# Patient Record
Sex: Female | Born: 1935
Health system: Southern US, Community
[De-identification: ages and names within clinical notes are randomized; demographics above are authoritative.]

## PROBLEM LIST (undated history)

## (undated) DIAGNOSIS — J189 Pneumonia, unspecified organism: Secondary | ICD-10-CM

## (undated) DIAGNOSIS — Z789 Other specified health status: Secondary | ICD-10-CM

## (undated) HISTORY — PX: OTHER SURGICAL HISTORY: SHX169

## (undated) HISTORY — PX: APPENDECTOMY: SHX54

---

## 2003-06-21 ENCOUNTER — Inpatient Hospital Stay (HOSPITAL_COMMUNITY): Admission: EM | Admit: 2003-06-21 | Discharge: 2003-06-24 | Payer: Self-pay | Admitting: *Deleted

## 2003-06-27 ENCOUNTER — Encounter: Admission: RE | Admit: 2003-06-27 | Discharge: 2003-06-27 | Payer: Self-pay | Admitting: Family Medicine

## 2011-06-26 ENCOUNTER — Inpatient Hospital Stay (HOSPITAL_COMMUNITY)
Admission: EM | Admit: 2011-06-26 | Discharge: 2011-06-30 | DRG: 195 | Disposition: A | Discharge status: Home / Self Care | Payer: Medicare Other | Attending: Internal Medicine | Admitting: Internal Medicine

## 2011-06-26 ENCOUNTER — Emergency Department (HOSPITAL_COMMUNITY): Payer: Medicare Other

## 2011-06-26 DIAGNOSIS — E876 Hypokalemia: Secondary | ICD-10-CM | POA: Diagnosis present

## 2011-06-26 DIAGNOSIS — R0902 Hypoxemia: Secondary | ICD-10-CM | POA: Diagnosis present

## 2011-06-26 DIAGNOSIS — J189 Pneumonia, unspecified organism: Principal | ICD-10-CM

## 2011-06-26 DIAGNOSIS — D72829 Elevated white blood cell count, unspecified: Secondary | ICD-10-CM | POA: Diagnosis present

## 2011-06-26 HISTORY — DX: Pneumonia, unspecified organism: J18.9

## 2011-06-26 HISTORY — DX: Other specified health status: Z78.9

## 2011-06-26 LAB — DIFFERENTIAL
Lymphocytes Relative: 5 % — ABNORMAL LOW (ref 12–46)
Monocytes Absolute: 1.3 10*3/uL — ABNORMAL HIGH (ref 0.1–1.0)
Monocytes Relative: 8 % (ref 3–12)
Neutro Abs: 14.5 10*3/uL — ABNORMAL HIGH (ref 1.7–7.7)
Neutrophils Relative %: 87 % — ABNORMAL HIGH (ref 43–77)

## 2011-06-26 LAB — BASIC METABOLIC PANEL
BUN: 16 mg/dL (ref 6–23)
CO2: 29 mEq/L (ref 19–32)
Calcium: 8.1 mg/dL — ABNORMAL LOW (ref 8.4–10.5)
Chloride: 99 mEq/L (ref 96–112)
Creatinine, Ser: 0.56 mg/dL (ref 0.50–1.10)
GFR calc Af Amer: >90 mL/min (ref >90)
GFR calc non Af Amer: 84 mL/min — ABNORMAL LOW (ref >90)
Glucose, Bld: 158 mg/dL — ABNORMAL HIGH (ref 70–99)
Potassium: 3.5 mEq/L (ref 3.5–5.1)
Sodium: 134 mEq/L — ABNORMAL LOW (ref 135–145)

## 2011-06-26 LAB — URINALYSIS, ROUTINE W REFLEX MICROSCOPIC
Glucose, UA: NEGATIVE mg/dL
Protein, ur: 30 mg/dL — AB
Urobilinogen, UA: 4 mg/dL — ABNORMAL HIGH (ref 0.0–1.0)

## 2011-06-26 LAB — URINE MICROSCOPIC-ADD ON

## 2011-06-26 LAB — COMPREHENSIVE METABOLIC PANEL
AST: 207 U/L — ABNORMAL HIGH (ref 0–37)
Albumin: 2.1 g/dL — ABNORMAL LOW (ref 3.5–5.2)
Alkaline Phosphatase: 174 U/L — ABNORMAL HIGH (ref 39–117)
BUN: 18 mg/dL (ref 6–23)
CO2: 29 mEq/L (ref 19–32)
Chloride: 99 mEq/L (ref 96–112)
Creatinine, Ser: 0.54 mg/dL (ref 0.50–1.10)
GFR calc non Af Amer: 85 mL/min — ABNORMAL LOW (ref >90)
Potassium: 2.8 mEq/L — ABNORMAL LOW (ref 3.5–5.1)
Total Bilirubin: 0.3 mg/dL (ref 0.3–1.2)

## 2011-06-26 LAB — CARDIAC PANEL(CRET KIN+CKTOT+MB+TROPI)
CK, MB: 1.6 ng/mL (ref 0.3–4.0)
Total CK: 27 U/L (ref 7–177)
Troponin I: <0.3 ng/mL (ref <0.30)

## 2011-06-26 LAB — CBC
HCT: 35.9 % — ABNORMAL LOW (ref 36.0–46.0)
Hemoglobin: 11.7 g/dL — ABNORMAL LOW (ref 12.0–15.0)
RBC: 3.91 MIL/uL (ref 3.87–5.11)
WBC: 16.7 10*3/uL — ABNORMAL HIGH (ref 4.0–10.5)

## 2011-06-26 LAB — MAGNESIUM: Magnesium: 2.2 mg/dL (ref 1.5–2.5)

## 2011-06-26 MED ORDER — POTASSIUM CHLORIDE CRYS ER 20 MEQ PO TBCR
40.0000 meq | EXTENDED_RELEASE_TABLET | ORAL | Status: AC
  Administered 2011-06-26: 40 meq via ORAL
  Filled 2011-06-26: qty 2

## 2011-06-26 MED ORDER — ALBUTEROL SULFATE (5 MG/ML) 0.5% IN NEBU
2.5000 mg | INHALATION_SOLUTION | Freq: Four times a day (QID) | RESPIRATORY_TRACT | Status: DC
  Administered 2011-06-27 (×3): 2.5 mg via RESPIRATORY_TRACT
  Filled 2011-06-26 (×4): qty 0.5

## 2011-06-26 MED ORDER — POTASSIUM CHLORIDE 10 MEQ/100ML IV SOLN
10.0000 meq | INTRAVENOUS | Status: AC
  Administered 2011-06-26 (×4): 10 meq via INTRAVENOUS
  Filled 2011-06-26 (×3): qty 100

## 2011-06-26 MED ORDER — MOXIFLOXACIN HCL IN NACL 400 MG/250ML IV SOLN
400.0000 mg | Freq: Once | INTRAVENOUS | Status: AC
  Administered 2011-06-26: 400 mg via INTRAVENOUS
  Filled 2011-06-26: qty 250

## 2011-06-26 MED ORDER — POTASSIUM CHLORIDE CRYS ER 20 MEQ PO TBCR
40.0000 meq | EXTENDED_RELEASE_TABLET | Freq: Once | ORAL | Status: AC
  Administered 2011-06-26: 40 meq via ORAL
  Filled 2011-06-26: qty 2

## 2011-06-26 MED ORDER — ALBUTEROL SULFATE (5 MG/ML) 0.5% IN NEBU
2.5000 mg | INHALATION_SOLUTION | RESPIRATORY_TRACT | Status: DC | PRN
  Administered 2011-06-26: 2.5 mg via RESPIRATORY_TRACT

## 2011-06-26 MED ORDER — ALBUTEROL SULFATE (5 MG/ML) 0.5% IN NEBU
5.0000 mg | INHALATION_SOLUTION | Freq: Once | RESPIRATORY_TRACT | Status: AC
  Administered 2011-06-26: 5 mg via RESPIRATORY_TRACT
  Filled 2011-06-26: qty 0.5

## 2011-06-26 MED ORDER — POTASSIUM CHLORIDE 20 MEQ PO PACK: 40.0000 meq | PACK | ORAL | Status: DC

## 2011-06-26 MED ORDER — IPRATROPIUM BROMIDE 0.02 % IN SOLN
0.5000 mg | Freq: Once | RESPIRATORY_TRACT | Status: AC
  Administered 2011-06-26: 0.5 mg via RESPIRATORY_TRACT
  Filled 2011-06-26: qty 2.5

## 2011-06-26 MED ORDER — POTASSIUM CHLORIDE IN NACL 40-0.9 MEQ/L-% IV SOLN
INTRAVENOUS | Status: DC
  Administered 2011-06-26 – 2011-06-28 (×5): via INTRAVENOUS
  Filled 2011-06-26 (×8): qty 1000

## 2011-06-26 MED ORDER — ALBUTEROL SULFATE (5 MG/ML) 0.5% IN NEBU: 2.5000 mg | INHALATION_SOLUTION | Freq: Once | RESPIRATORY_TRACT | Status: DC

## 2011-06-26 MED ORDER — GUAIFENESIN ER 600 MG PO TB12
600.0000 mg | ORAL_TABLET | Freq: Two times a day (BID) | ORAL | Status: DC
  Administered 2011-06-26 – 2011-06-30 (×8): 600 mg via ORAL
  Filled 2011-06-26 (×13): qty 1

## 2011-06-26 NOTE — ED Provider Notes (Signed)
History     CSN: [id number] Arrival date & time: 06/26/2011 12:54 PM   First MD Initiated Contact with Patient 06/26/11 1411      Chief Complaint  Patient presents with  . Weakness  . Cough    (Consider location/radiation/quality/duration/timing/severity/associated sxs/prior treatment) HPI Comments: PSA 77 year old woman who developed cough last Friday ago. She was seen by a doctor in the clinic in [place]. [facility] and was given for Biaxin.  Continued to feel poorly, and so she therefore sought reevaluation.  Patient is a 77 y.o. female presenting with cough. The history is provided by a relative and the patient. No language interpreter was used.  Cough This is a new problem. The current episode started more than 2 days ago. The problem occurs constantly. The problem has been gradually worsening. The cough is non-productive. Associated symptoms include sweats and shortness of breath. Treatments tried: Biaxin without clinical improvement. She is not a smoker.    History reviewed. No pertinent past medical history.  Past Surgical History  Procedure Date  . Hemoroidectomy     History reviewed. No pertinent family history.  History  Substance Use Topics  . Smoking status: Never Smoker   . Smokeless tobacco: Not on file  . Alcohol Use: No    OB History    Grav Para Term Preterm Abortions TAB SAB Ect Mult Living                  Review of Systems  Constitutional: Negative.  Negative for fever.  HENT: Negative.   Eyes: Negative.   Respiratory: Positive for cough and shortness of breath.   Cardiovascular: Negative.   Gastrointestinal: Negative.   Genitourinary: Negative.   Musculoskeletal: Negative.   Neurological: Negative.   Psychiatric/Behavioral: Negative.     Allergies  Penicillins  Home Medications   Current Outpatient Rx  Name Route Sig Dispense Refill  . CLARITHROMYCIN 500 MG PO TABS Oral Take 500 mg by mouth 2 (two) times daily. Pt started  on 06-25-11 for 7 day therapy      BP 111/62  Pulse 92  Temp(Src) 98.4 F (36.9 C) (Oral)  Resp 26  Ht 4' 11.5" (1.511 m)  Wt 130 lb (58.968 kg)  BMI 25.82 kg/m2  SpO2 92%  Physical Exam  Constitutional: She is oriented to person, place, and time.       Lady with a deep cough.  HENT:  Head: Normocephalic and atraumatic.  Right Ear: External ear normal.  Left Ear: External ear normal.  Mouth/Throat: Oropharynx is clear and moist.  Eyes: Conjunctivae and EOM are normal. Pupils are equal, round, and reactive to light.  Neck: Normal range of motion. Neck supple. No thyromegaly present.  Cardiovascular: Regular rhythm and normal heart sounds.        Resting tachycardia.  Pulmonary/Chest: Effort normal.       Has rhonchi over the right posterior chest.  Abdominal: Soft. Bowel sounds are normal. She exhibits no distension. There is no tenderness.  Musculoskeletal: Normal range of motion.  Lymphadenopathy:    She has no cervical adenopathy.  Neurological: She is alert and oriented to person, place, and time.       No sensory or motor deficit.  Skin: Skin is warm and dry.  Psychiatric: She has a normal mood and affect. Her behavior is normal.    ED Course  Procedures (including critical care time)  Labs Reviewed  CBC - Abnormal; Notable for the following:    WBC 16.7 (*)  Hemoglobin 11.7 (*)    HCT 35.9 (*)    All other components within normal limits  DIFFERENTIAL - Abnormal; Notable for the following:    Neutrophils Relative 87 (*)    Neutro Abs 14.5 (*)    Lymphocytes Relative 5 (*)    Monocytes Absolute 1.3 (*)    All other components within normal limits  COMPREHENSIVE METABOLIC PANEL - Abnormal; Notable for the following:    Potassium 2.8 (*)    Albumin 2.1 (*)    AST 207 (*)    ALT 145 (*)    Alkaline Phosphatase 174 (*)    GFR calc non Af Amer 85 (*)    All other components within normal limits  URINALYSIS, ROUTINE W REFLEX MICROSCOPIC - Abnormal; Notable  for the following:    Color, Urine AMBER (*) BIOCHEMICALS MAY BE AFFECTED BY COLOR   APPearance CLOUDY (*)    Hgb urine dipstick TRACE (*)    Bilirubin Urine MODERATE (*)    Ketones, ur TRACE (*)    Protein, ur 30 (*)    Urobilinogen, UA 4.0 (*)    All other components within normal limits  PRO B NATRIURETIC PEPTIDE - Abnormal; Notable for the following:    Pro B Natriuretic peptide (BNP) 492.1 (*)    All other components within normal limits  D-DIMER, QUANTITATIVE - Abnormal; Notable for the following:    D-Dimer, Quant 2.91 (*)    All other components within normal limits  URINE MICROSCOPIC-ADD ON - Abnormal; Notable for the following:    Squamous Epithelial / LPF FEW (*)    Bacteria, UA MANY (*)    All other components within normal limits  CARDIAC PANEL(CRET KIN+CKTOT+MB+TROPI)  LACTIC ACID, PLASMA  PROCALCITONIN  URINE CULTURE  CULTURE, BLOOD (ROUTINE X 2)  CULTURE, BLOOD (ROUTINE X 2)   Dg Chest 2 View  06/26/2011  *RADIOLOGY REPORT*  Clinical Data: Productive cough. Shortness of breath.  CHEST - 2 VIEW  Comparison: None.  Findings: Consolidation/infiltrate right middle lobe and right lung base.  Recommend follow-up until clearance.  Cardiomegaly and pulmonary vascular congestion/mild congestive heart failure.  No gross pneumothorax.  Tortuous aorta.  IMPRESSION: Consolidation/infiltrate right middle lobe and right lung base. Recommend follow-up until clearance.  Cardiomegaly and pulmonary vascular congestion/mild congestive heart failure.  Tortuous aorta.  Original Report Authenticated By: Fuller Canada, M.D.    Date: 06/26/2011  Rate: 112  Rhythm: sinus tachycardia  QRS Axis: left  Intervals: normal  ST/T Wave abnormalities: normal  Conduction Disutrbances:none  Narrative Interpretation: Normal EKG.  Old EKG Reviewed: unchanged  4:57 PM Lab workup reveals a right lower lobe pneumonia. Treatment will be with Rocephin and Zithromax. Call to try at hospitalists to  admit the patient  1. Community acquired pneumonia          Carleene Cooper III, MD 06/26/11 1700

## 2011-06-26 NOTE — ED Notes (Signed)
Urine specimen collected and sent to lab for potential testing

## 2011-06-26 NOTE — H&P (Signed)
Hospital Admission Note Date: 06/26/2011  Patient name: PHILIS DOKE Medical record number: [id number] Date of birth: 02-May-1926 Age: 77 y.o. Gender: female PCP: Tomi Bamberger, NP, NP  Chief Complaint: lethargy  History of Present Illness: Independent, active 77 yo with no medical problems presents with 1 week severe daytime sleepiness out of the blue.  No energy.  No appetite.  Some wheezing and mild shortness of breath.  No chest pain.  No sig. cough Meds: Medications Prior to Admission  Medication Dose Route Frequency Provider Last Rate Last Dose  . albuterol (PROVENTIL) (5 MG/ML) 0.5% nebulizer solution 5 mg  5 mg Nebulization Once Carleene Cooper III, MD   5 mg at 06/26/11 1430  . ipratropium (ATROVENT) nebulizer solution 0.5 mg  0.5 mg Nebulization Once Carleene Cooper III, MD   0.5 mg at 06/26/11 1430  . moxifloxacin (AVELOX) IVPB 400 mg  400 mg Intravenous Once Carleene Cooper III, MD   400 mg at 06/26/11 1708  . potassium chloride 10 mEq in 100 mL IVPB  10 mEq Intravenous Q1 Hr x 3 Buena Irish, MD      . potassium chloride SA (K-DUR,KLOR-CON) CR tablet 40 mEq  40 mEq Oral Once Carleene Cooper III, MD   40 mEq at 06/26/11 1720  . potassium chloride SA (K-DUR,KLOR-CON) CR tablet 40 mEq  40 mEq Oral STAT Carleene Cooper III, MD      . DISCONTD: albuterol (PROVENTIL) (5 MG/ML) 0.5% nebulizer solution 2.5 mg  2.5 mg Nebulization Once Carleene Cooper III, MD      . DISCONTD: potassium chloride (KLOR-CON) packet 40 mEq  40 mEq Oral STAT Buena Irish, MD       No current outpatient prescriptions on file as of 06/26/2011.     Allergies: Penicillins Past Medical History  Diagnosis Date  . Pneumonia 06/26/11    currently  . No pertinent past medical history     pt denies any problems   Past Surgical History  Procedure Date  . Hemoroidectomy    History reviewed. No pertinent family history. History   Social History  . Marital Status: Widowed    Spouse Name: N/A    Number  of Children: N/A  . Years of Education: N/A   Occupational History  . Not on file.   Social History Main Topics  . Smoking status: Never Smoker   . Smokeless tobacco: Never Used  . Alcohol Use: No  . Drug Use: No  . Sexually Active:    Other Topics Concern  . Not on file   Social History Narrative  . No narrative on file    Review of Systems: Review of Systems  Constitutional: Negative for fever, chills and weight loss.  HENT: Negative for hearing loss, ear pain, nosebleeds, congestion, sore throat, neck pain, tinnitus and ear discharge.   Eyes: Negative for blurred vision, double vision, photophobia, pain, discharge and redness.  Respiratory: Positive for cough, shortness of breath and wheezing. Negative for hemoptysis, sputum production and stridor.   Cardiovascular: Negative for chest pain, palpitations, orthopnea, claudication, leg swelling and PND.  Gastrointestinal: Negative for heartburn, nausea, vomiting, abdominal pain, diarrhea, constipation, blood in stool and melena.  Genitourinary: Negative for dysuria, urgency, frequency, hematuria and flank pain.  Musculoskeletal: Negative for myalgias, back pain, joint pain and falls.  Skin: Negative for itching and rash.  Neurological: Negative for dizziness, tremors, sensory change, speech change, focal weakness, seizures, loss of consciousness and headaches.  Endo/Heme/Allergies: Negative for environmental allergies and polydipsia. Does  not bruise/bleed easily.  Psychiatric/Behavioral: Negative for depression, suicidal ideas, hallucinations, memory loss and substance abuse. The patient is not nervous/anxious and does not have insomnia.   All other systems reviewed and are negative.     Physical Exam: Physical Exam  Constitutional: She is oriented to person, place, and time. She appears well-developed and well-nourished. No distress.  HENT:  Head: Normocephalic and atraumatic.  Nose: Nose normal.  Mouth/Throat:  Oropharynx is clear and moist. No oropharyngeal exudate.  Eyes: Conjunctivae and EOM are normal. Pupils are equal, round, and reactive to light. Right eye exhibits discharge. Left eye exhibits no discharge. No scleral icterus.  Neck: Normal range of motion. Neck supple. No JVD present. No tracheal deviation present. No thyromegaly present.  Cardiovascular: Normal rate, regular rhythm, normal heart sounds and intact distal pulses.  Exam reveals no gallop and no friction rub.   No murmur heard. Pulmonary/Chest: No stridor. She has wheezes. She has no rales. She exhibits no tenderness.       Right lung with poor air movement, some squeaks 2/3 lung field   Abdominal: Soft. Bowel sounds are normal. She exhibits no distension and no mass. There is no tenderness. There is no rebound and no guarding.  Musculoskeletal: She exhibits no edema and no tenderness.  Lymphadenopathy:    She has no cervical adenopathy.  Neurological: She is alert and oriented to person, place, and time. She has normal reflexes. No cranial nerve deficit. She exhibits normal muscle tone. Coordination normal.  Skin: Skin is warm. No rash noted. She is diaphoretic. No erythema. No pallor.  Psychiatric: She has a normal mood and affect. Her behavior is normal. Judgment and thought content normal.     Filed Vitals:   06/26/11 1305 06/26/11 1431 06/26/11 1540 06/26/11 1625  BP: 127/76   111/62  Pulse: 103   92  Temp: 98.4 F (36.9 C)     TempSrc: Oral     Resp: 24   26  Height:      Weight:      SpO2: 93% 93% 93% 92%    Lab results: Results for orders placed during the hospital encounter of 06/26/11  CBC      Component Value Range   WBC 16.7 (*) 4.0 - 10.5 (K/uL)   RBC 3.91  3.87 - 5.11 (MIL/uL)   Hemoglobin 11.7 (*) 12.0 - 15.0 (g/dL)   HCT 16.1 (*) 09.6 - 46.0 (%)   MCV 91.8  78.0 - 100.0 (fL)   MCH 29.9  26.0 - 34.0 (pg)   MCHC 32.6  30.0 - 36.0 (g/dL)   RDW 04.5  40.9 - 81.1 (%)   Platelets 316  150 - 400  (K/uL)  DIFFERENTIAL      Component Value Range   Neutrophils Relative 87 (*) 43 - 77 (%)   Neutro Abs 14.5 (*) 1.7 - 7.7 (K/uL)   Lymphocytes Relative 5 (*) 12 - 46 (%)   Lymphs Abs 0.9  0.7 - 4.0 (K/uL)   Monocytes Relative 8  3 - 12 (%)   Monocytes Absolute 1.3 (*) 0.1 - 1.0 (K/uL)   Eosinophils Relative 0  0 - 5 (%)   Eosinophils Absolute 0.0  0.0 - 0.7 (K/uL)   Basophils Relative 0  0 - 1 (%)   Basophils Absolute 0.0  0.0 - 0.1 (K/uL)  COMPREHENSIVE METABOLIC PANEL      Component Value Range   Sodium 137  135 - 145 (mEq/L)   Potassium 2.8 (*)  3.5 - 5.1 (mEq/L)   Chloride 99  96 - 112 (mEq/L)   CO2 29  19 - 32 (mEq/L)   Glucose, Bld 97  70 - 99 (mg/dL)   BUN 18  6 - 23 (mg/dL)   Creatinine, Ser 1.61  0.50 - 1.10 (mg/dL)   Calcium 8.4  8.4 - 09.6 (mg/dL)   Total Protein 6.2  6.0 - 8.3 (g/dL)   Albumin 2.1 (*) 3.5 - 5.2 (g/dL)   AST 045 (*) 0 - 37 (U/L)   ALT 145 (*) 0 - 35 (U/L)   Alkaline Phosphatase 174 (*) 39 - 117 (U/L)   Total Bilirubin 0.3  0.3 - 1.2 (mg/dL)   GFR calc non Af Amer 85 (*) >90 (mL/min)   GFR calc Af Amer >90  >90 (mL/min)  URINALYSIS, ROUTINE W REFLEX MICROSCOPIC      Component Value Range   Color, Urine AMBER (*) YELLOW    APPearance CLOUDY (*) CLEAR    Specific Gravity, Urine 1.028  1.005 - 1.030    pH 6.5  5.0 - 8.0    Glucose, UA NEGATIVE  NEGATIVE (mg/dL)   Hgb urine dipstick TRACE (*) NEGATIVE    Bilirubin Urine MODERATE (*) NEGATIVE    Ketones, ur TRACE (*) NEGATIVE (mg/dL)   Protein, ur 30 (*) NEGATIVE (mg/dL)   Urobilinogen, UA 4.0 (*) 0.0 - 1.0 (mg/dL)   Nitrite NEGATIVE  NEGATIVE    Leukocytes, UA NEGATIVE  NEGATIVE   CARDIAC PANEL(CRET KIN+CKTOT+MB+TROPI)      Component Value Range   Total CK 27  7 - 177 (U/L)   CK, MB 1.6  0.3 - 4.0 (ng/mL)   Troponin I <0.30  <0.30 (ng/mL)   Relative Index RELATIVE INDEX IS INVALID  0.0 - 2.5   PRO B NATRIURETIC PEPTIDE      Component Value Range   Pro B Natriuretic peptide (BNP) 492.1 (*) 0 -  450 (pg/mL)  D-DIMER, QUANTITATIVE      Component Value Range   D-Dimer, Quant 2.91 (*) 0.00 - 0.48 (ug/mL-FEU)  LACTIC ACID, PLASMA      Component Value Range   Lactic Acid, Venous 0.9  0.5 - 2.2 (mmol/L)  PROCALCITONIN      Component Value Range   Procalcitonin 0.17    URINE MICROSCOPIC-ADD ON      Component Value Range   Squamous Epithelial / LPF FEW (*) RARE    RBC / HPF 7-10  <3 (RBC/hpf)   Bacteria, UA MANY (*) RARE    Urine-Other MUCOUS PRESENT     Imaging results:  Dg Chest 2 View  06/26/2011  *RADIOLOGY REPORT*  Clinical Data: Productive cough. Shortness of breath.  CHEST - 2 VIEW  Comparison: None.  Findings: Consolidation/infiltrate right middle lobe and right lung base.  Recommend follow-up until clearance.  Cardiomegaly and pulmonary vascular congestion/mild congestive heart failure.  No gross pneumothorax.  Tortuous aorta.  IMPRESSION: Consolidation/infiltrate right middle lobe and right lung base. Recommend follow-up until clearance.  Cardiomegaly and pulmonary vascular congestion/mild congestive heart failure.  Tortuous aorta.  Original Report Authenticated By: Fuller Canada, M.D.   Other results: EKG:  Normal sinus rhythm.  Normal EKG I personally reviewed the EKG.  Assessment & Plan by Problem:  #1. Pneumonia-Patient is tachypneic and has significant abnormal lung sounds.  Admit, IV Levaquin, nebs and follow.  #2.  Severe hypokalemia- Likely secondary to no eating.  Replace.  Check Magnesium.  Patient feels much better already.  Patient Active Problem List  Diagnoses  . Pneumonia  . Hypokalemia    Greater than 70 minutes was spent on direct patient care and coordination of care.  Kysen Wetherington 06/26/2011, 6:38 PM

## 2011-06-26 NOTE — ED Notes (Signed)
Pt brought by ems with c/o cough and weakness since last Friday. Pt went to pcp yesterday and was prescribed po antibiotics but pt not feeling better and pt was supposed to go back to pcp today for chest xray.

## 2011-06-26 NOTE — ED Notes (Signed)
Pt's son reports patient has had a cough and weakness for the past week. Went to PCP yesterday and was prescribed an ABX. Son reports pt feeling worse and came to the ER for further evaluation. Pt's bilateral lungs have expiratory wheezing. Pt alert and oriented x 3, neuro intact.

## 2011-06-27 DIAGNOSIS — D72829 Elevated white blood cell count, unspecified: Secondary | ICD-10-CM | POA: Diagnosis present

## 2011-06-27 LAB — CBC
HCT: 33.3 % — ABNORMAL LOW (ref 36.0–46.0)
Hemoglobin: 10.5 g/dL — ABNORMAL LOW (ref 12.0–15.0)
MCH: 29.7 pg (ref 26.0–34.0)
MCHC: 31.5 g/dL (ref 30.0–36.0)
MCV: 94.1 fL (ref 78.0–100.0)
Platelets: 310 K/uL (ref 150–400)
RBC: 3.54 MIL/uL — ABNORMAL LOW (ref 3.87–5.11)
RDW: 13.2 % (ref 11.5–15.5)
WBC: 13 K/uL — ABNORMAL HIGH (ref 4.0–10.5)

## 2011-06-27 LAB — BASIC METABOLIC PANEL WITH GFR
BUN: 13 mg/dL (ref 6–23)
CO2: 28 meq/L (ref 19–32)
Calcium: 8.3 mg/dL — ABNORMAL LOW (ref 8.4–10.5)
Chloride: 107 meq/L (ref 96–112)
Creatinine, Ser: 0.6 mg/dL (ref 0.50–1.10)
GFR calc Af Amer: >90 mL/min (ref >90)
GFR calc non Af Amer: 82 mL/min — ABNORMAL LOW (ref >90)
Glucose, Bld: 115 mg/dL — ABNORMAL HIGH (ref 70–99)
Potassium: 4.3 meq/L (ref 3.5–5.1)
Sodium: 140 meq/L (ref 135–145)

## 2011-06-27 LAB — STREP PNEUMONIAE URINARY ANTIGEN: Strep Pneumo Urinary Antigen: NEGATIVE

## 2011-06-27 LAB — URINE CULTURE: Colony Count: >=100000

## 2011-06-27 LAB — LEGIONELLA ANTIGEN, URINE: Legionella Antigen, Urine: NEGATIVE

## 2011-06-27 LAB — RAPID HIV SCREEN (WH-MAU): Rapid HIV Screen: NONREACTIVE

## 2011-06-27 MED ORDER — ALBUTEROL SULFATE (5 MG/ML) 0.5% IN NEBU
2.5000 mg | INHALATION_SOLUTION | Freq: Three times a day (TID) | RESPIRATORY_TRACT | Status: DC
  Administered 2011-06-28 – 2011-06-30 (×7): 2.5 mg via RESPIRATORY_TRACT
  Filled 2011-06-27 (×7): qty 0.5

## 2011-06-27 MED ORDER — ZOLPIDEM TARTRATE 5 MG PO TABS
5.0000 mg | ORAL_TABLET | Freq: Once | ORAL | Status: AC
  Administered 2011-06-27: 5 mg via ORAL
  Filled 2011-06-27: qty 1

## 2011-06-27 MED ORDER — ENOXAPARIN SODIUM 40 MG/0.4ML ~~LOC~~ SOLN
40.0000 mg | SUBCUTANEOUS | Status: DC
  Administered 2011-06-27 – 2011-06-30 (×4): 40 mg via SUBCUTANEOUS
  Filled 2011-06-27 (×6): qty 0.4

## 2011-06-27 MED ORDER — ENOXAPARIN SODIUM 40 MG/0.4ML ~~LOC~~ SOLN: 40.0000 mg | SUBCUTANEOUS | Status: DC

## 2011-06-27 MED ORDER — ZOLPIDEM TARTRATE 5 MG PO TABS
5.0000 mg | ORAL_TABLET | Freq: Every evening | ORAL | Status: DC | PRN
  Administered 2011-06-27 – 2011-06-29 (×3): 5 mg via ORAL
  Filled 2011-06-27 (×2): qty 1

## 2011-06-27 MED ORDER — POTASSIUM CHLORIDE CRYS ER 20 MEQ PO TBCR
20.0000 meq | EXTENDED_RELEASE_TABLET | Freq: Every day | ORAL | Status: DC
  Administered 2011-06-27 – 2011-06-30 (×4): 20 meq via ORAL
  Filled 2011-06-27 (×6): qty 1

## 2011-06-27 MED ORDER — LEVOFLOXACIN IN D5W 500 MG/100ML IV SOLN
500.0000 mg | INTRAVENOUS | Status: DC
  Administered 2011-06-27 – 2011-06-29 (×3): 500 mg via INTRAVENOUS
  Filled 2011-06-27 (×4): qty 100

## 2011-06-27 NOTE — Progress Notes (Signed)
ANTICOAGULATION CONSULT NOTE - Initial Consult  Pharmacy Consult for Lovenox  Indication: VTE prophylaxis  Allergies  Allergen Reactions  . Penicillins Hives    Patient Measurements: Height: 4' 11.5" (151.1 cm) Weight: 130 lb (58.968 kg) IBW/kg (Calculated) : 44.35  Adjusted Body Weight:   Vital Signs: Temp: 98.1 F (36.7 C) (12/13 0610) Temp src: Oral (12/13 0610) BP: 129/65 mmHg (12/13 0610) Pulse Rate: 85  (12/13 0610)  Labs:  Basename 06/27/11 0503 06/26/11 2030 06/26/11 1520  HGB 10.5* -- 11.7*  HCT 33.3* -- 35.9*  PLT 310 -- 316  APTT -- -- --  LABPROT -- -- --  INR -- -- --  HEPARINUNFRC -- -- --  CREATININE 0.60 0.56 0.54  CKTOTAL -- -- 27  CKMB -- -- 1.6  TROPONINI -- -- <0.30   Estimated Creatinine Clearance: 42.2 ml/min (by C-G formula based on Cr of 0.6).  Medical History: Past Medical History  Diagnosis Date  . Pneumonia 06/26/11    currently  . No pertinent past medical history     pt denies any problems      Assessment: Order for lovenox per pharmacy for VTE prophylaxis.  Renal function is > 72mL/min Goal of Therapy:  Enoxaparin dosed based on patient weight and renal function    Plan:  Lovenox 40mg  sq q24hr  Aleene Davidson Crowford 06/27/2011,7:02 AM

## 2011-06-27 NOTE — Progress Notes (Signed)
Subjective: Patient states that she feels much better this morning. The patient is alert and oriented x3. She has no significant complaints and feels that her breathing has improved since arriving to the emergency room yesterday.  Interval history: Nursing reports that the patient's sats dropped during the night and required a nonrebreather for a short period of time. The patient is now on 6 L of oxygen by nasal cannula and saturating at 97%.  Objective: Filed Vitals:   06/26/11 2302 06/26/11 2340 06/27/11 0241 06/27/11 0610  BP: 123/64  118/63 129/65  Pulse: 85  85 85  Temp: 98.3 F (36.8 C)  98.3 F (36.8 C) 98.1 F (36.7 C)  TempSrc: Oral   Oral  Resp: 20  24 24   Height:      Weight:      SpO2: 90% 89% 100% 92%   Weight change:   Intake/Output Summary (Last 24 hours) at 06/27/11 0647 Last data filed at 06/27/11 0405  Gross per 24 hour  Intake    240 ml  Output    550 ml  Net   -310 ml    General: Well-nourished well-developed female who appears her stated age. Alert, awake, oriented x3, in no acute distress.  HEENT: [place]/AT PEERL, EOMI Neck: Trachea midline,  no masses, no thyromegal,y no JVD, no carotid bruit OROPHARYNX:  Moist, No exudate/ erythema/lesions. Lymph node survey: No cervical axillary or inguinal lymphadenopathy noted. Heart: Regular rate and rhythm, without murmurs, rubs, gallops, PMI non-displaced, no heaves or thrills on palpation.  Lungs: Clear to auscultation, no wheezing or rhonchi noted. No increased vocal fremitus resonant to percussion  Abdomen: Soft, nontender, nondistended, positive bowel sounds, no masses no hepatosplenomegaly noted..  Neuro: No focal neurological deficits noted cranial nerves II through XII grossly intact. DTRs 2+ bilaterally upper and lower extremities. Strength 5 out of 5 in bilateral upper and lower extremities. Musculoskeletal: No warm swelling or erythema around joints, no spinal tenderness noted. Psychiatric: Patient alert and  oriented x3, good insight and cognition, good recent to remote recall.   Lab Results:  [facility] 06/27/11 0503 06/26/11 2030  NA 140 134*  K 4.3 3.5  CL 107 99  CO2 28 29  GLUCOSE 115* 158*  BUN 13 16  CREATININE 0.60 0.56  CALCIUM 8.3* 8.1*  MG -- 2.2  PHOS -- --    Basename 06/26/11 1520  AST 207*  ALT 145*  ALKPHOS 174*  BILITOT 0.3  PROT 6.2  ALBUMIN 2.1*   No results found for this basename: LIPASE:2,AMYLASE:2 in the last 72 hours  Basename 06/27/11 0503 06/26/11 1520  WBC 13.0* 16.7*  NEUTROABS -- 14.5*  HGB 10.5* 11.7*  HCT 33.3* 35.9*  MCV 94.1 91.8  PLT 310 316    Basename 06/26/11 1520  CKTOTAL 27  CKMB 1.6  CKMBINDEX --  TROPONINI <0.30   No components found with this basename: POCBNP:3  Basename 06/26/11 1520  DDIMER 2.91*   No results found for this basename: HGBA1C:2 in the last 72 hours No results found for this basename: CHOL:2,HDL:2,LDLCALC:2,TRIG:2,CHOLHDL:2,LDLDIRECT:2 in the last 72 hours No results found for this basename: TSH,T4TOTAL,FREET3,T3FREE,THYROIDAB in the last 72 hours No results found for this basename: VITAMINB12:2,FOLATE:2,FERRITIN:2,TIBC:2,IRON:2,RETICCTPCT:2 in the last 72 hours  Micro Results: No results found for this or any previous visit (from the past 240 hour(s)).  Studies/Results: Dg Chest 2 View  06/26/2011  *RADIOLOGY REPORT*  Clinical Data: Productive cough. Shortness of breath.  CHEST - 2 VIEW  Comparison: None.  Findings:  Consolidation/infiltrate right middle lobe and right lung base.  Recommend follow-up until clearance.  Cardiomegaly and pulmonary vascular congestion/mild congestive heart failure.  No gross pneumothorax.  Tortuous aorta.  IMPRESSION: Consolidation/infiltrate right middle lobe and right lung base. Recommend follow-up until clearance.  Cardiomegaly and pulmonary vascular congestion/mild congestive heart failure.  Tortuous aorta.  Original Report Authenticated By: Fuller Canada, M.D.     Medications: I have reviewed the patient's current medications. Scheduled Meds:   . albuterol  2.5 mg Nebulization Q6H  . albuterol  5 mg Nebulization Once  . guaiFENesin  600 mg Oral BID  . ipratropium  0.5 mg Nebulization Once  . levofloxacin (LEVAQUIN) IV  500 mg Intravenous Q24H  . moxifloxacin  400 mg Intravenous Once  . potassium chloride  10 mEq Intravenous Q1 Hr x 3  . potassium chloride  40 mEq Oral Once  . potassium chloride  40 mEq Oral STAT  . DISCONTD: albuterol  2.5 mg Nebulization Once  . DISCONTD: enoxaparin  40 mg Subcutaneous Q24H  . DISCONTD: potassium chloride  40 mEq Oral STAT   Continuous Infusions:   . 0.9 % NaCl with KCl 40 mEq / L 75 mL/hr at 06/27/11 0355   PRN Meds:.albuterol Assessment/Plan: Patient Active Hospital Problem List: Pneumonia (06/26/2011)   Assessment: Patient with right middle and lower lobe pneumonia. Appears to have had some improvement with IV antibiotics. She was initially given Avelox in the emergency room but has been changed to Levaquin every 24 hours. I will continue the patient on IV antibiotics for the first 48 hours and assess her oxygen requirement. The patient also received pulmonary toiletry and albuterol nebulizers as needed for any airway constriction associated with her pneumonia. The patient will be also given supportive care is necessary in the form of oxygen and IV fluids.     Hypokalemia (06/26/2011)   Assessment: The patient was significantly hypokalemic upon arrival to the emergency room. She has received IV potassium as well as oral potassium. Her magnesium level was checked and found to be within normal ranges a 2.2.    Leukocytosis (06/27/2011)   Assessment: The patient has leukocytosis associated with her pneumonia. I suspect it is the pneumonia clears the leukocytosis will resolve. We will continue to monitor.   DVT prophylaxis with Lovenox.  Mobility: PT eval and treat.  LOS: 1 day

## 2011-06-27 NOTE — ED Notes (Signed)
Pt to [facility] via strecher ,monitor with family in stable condtion

## 2011-06-27 NOTE — ED Notes (Signed)
Report given to Tess [facility]

## 2011-06-27 NOTE — Progress Notes (Signed)
UR review completed. mp 

## 2011-06-28 LAB — BASIC METABOLIC PANEL
CO2: 30 mEq/L (ref 19–32)
Calcium: 8.6 mg/dL (ref 8.4–10.5)
Chloride: 103 mEq/L (ref 96–112)
Creatinine, Ser: 0.51 mg/dL (ref 0.50–1.10)
Glucose, Bld: 107 mg/dL — ABNORMAL HIGH (ref 70–99)

## 2011-06-28 LAB — CBC
HCT: 34.6 % — ABNORMAL LOW (ref 36.0–46.0)
Hemoglobin: 10.9 g/dL — ABNORMAL LOW (ref 12.0–15.0)
MCH: 30.1 pg (ref 26.0–34.0)
MCHC: 31.5 g/dL (ref 30.0–36.0)
MCV: 95.6 fL (ref 78.0–100.0)
Platelets: 310 10*3/uL (ref 150–400)
RBC: 3.62 MIL/uL — ABNORMAL LOW (ref 3.87–5.11)
RDW: 13.6 % (ref 11.5–15.5)
WBC: 12.3 10*3/uL — ABNORMAL HIGH (ref 4.0–10.5)

## 2011-06-28 LAB — DIFFERENTIAL
Eosinophils Absolute: 0.1 10*3/uL (ref 0.0–0.7)
Eosinophils Relative: 1 % (ref 0–5)
Lymphocytes Relative: 7 % — ABNORMAL LOW (ref 12–46)
Lymphs Abs: 0.9 10*3/uL (ref 0.7–4.0)
Monocytes Absolute: 1 10*3/uL (ref 0.1–1.0)

## 2011-06-28 LAB — EXPECTORATED SPUTUM ASSESSMENT W GRAM STAIN, RFLX TO RESP C

## 2011-06-28 NOTE — Progress Notes (Signed)
Spoke with pt concerning discharge needs, Pt's son at the bedside. Pt states she has no needs at present time. Plan to dc home with family. mp

## 2011-06-28 NOTE — Progress Notes (Signed)
Subjective: Patient states that she feels much better today. Patient's son is at the bedside and dorsi that his mother looks much better than when he saw her yesterday. Patient's oxygen requirement has decreased to 3 L of oxygen Objective: Filed Vitals:   06/28/11 0920 06/28/11 0925 06/28/11 1327 06/28/11 1506  BP:   133/64   Pulse:   89   Temp:   97.9 F (36.6 C)   TempSrc:   Oral   Resp:   18   Height:      Weight:      SpO2: 88% 93% 93% 93%   Weight change:   Intake/Output Summary (Last 24 hours) at 06/28/11 1840 Last data filed at 06/28/11 1300  Gross per 24 hour  Intake   1265 ml  Output   2125 ml  Net   -860 ml    General: Alert, awake, oriented x3, in no acute distress.  HEENT: /AT [place]ER[place], EOMI Neck: Trachea midline,  no masses, no thyromegal,y no JVD, no carotid bruit OROPHARYNX:  Moist, No exudate/ erythema/lesions.  Heart: Regular rate and rhythm, without murmurs, rubs, gallops, PMI non-displaced, no heaves or thrills on palpation.  Lungs: Clear to auscultation, no wheezing or rhonchi noted. No increased vocal fremitus resonant to percussion  Abdomen: Soft, nontender, nondistended, positive bowel sounds, no masses no hepatosplenomegaly noted..  Neuro: No focal neurological deficits noted cranial nerves II through XII grossly intact. DTRs 2+ bilaterally upper and lower extremities. Strength functional in bilateral upper and lower extremities. Musculoskeletal: No warm swelling or erythema around joints, no spinal tenderness noted. Psychiatric: Patient alert and oriented x3, good insight and cognition, good recent to remote recall. Lymph node survey: No cervical axillary or inguinal lymphadenopathy noted.   Lab Results:  Basename 06/28/11 0435 06/27/11 0503 06/26/11 2030  NA 137 140 --  K 4.5 4.3 --  CL 103 107 --  CO2 30 28 --  GLUCOSE 107* 115* --  BUN 9 13 --  CREATININE 0.51 0.60 --  CALCIUM 8.6 8.3* --  MG -- -- 2.2  PHOS -- -- --    Basename 06/26/11  1520  AST 207*  ALT 145*  ALKPHOS 174*  BILITOT 0.3  PROT 6.2  ALBUMIN 2.1*   No results found for this basename: LIPASE:2,AMYLASE:2 in the last 72 hours  Basename 06/28/11 0435 06/27/11 0503 06/26/11 1520  WBC 12.3* 13.0* --  NEUTROABS 10.2* -- 14.5*  HGB 10.9* 10.5* --  HCT 34.6* 33.3* --  MCV 95.6 94.1 --  PLT 310 310 --    Basename 06/26/11 1520  CKTOTAL 27  CKMB 1.6  CKMBINDEX --  TROPONINI <0.30   No components found with this basename: POCBNP:3  Basename 06/26/11 1520  DDIMER 2.91*   No results found for this basename: HGBA1C:2 in the last 72 hours No results found for this basename: CHOL:2,HDL:2,LDLCALC:2,TRIG:2,CHOLHDL:2,LDLDIRECT:2 in the last 72 hours No results found for this basename: TSH,T4TOTAL,FREET3,T3FREE,THYROIDAB in the last 72 hours No results found for this basename: VITAMINB12:2,FOLATE:2,FERRITIN:2,TIBC:2,IRON:2,RETICCTPCT:2 in the last 72 hours  Micro Results: Recent Results (from the past 240 hour(s))  URINE CULTURE     Status: Normal   Collection Time   06/26/11  2:46 PM      Component Value Range Status Comment   Specimen Description URINE, CLEAN CATCH   Final    Special Requests NONE   Final    Setup Time [id number]   Final    Colony Count >=100,000 COLONIES/ML   Final    Culture  Final    Value: Multiple bacterial morphotypes present, none predominant. Suggest appropriate recollection if clinically indicated.   Report Status 06/27/2011 FINAL   Final   CULTURE, BLOOD (ROUTINE X 2)     Status: Normal (Preliminary result)   Collection Time   06/26/11  3:15 PM      Component Value Range Status Comment   Specimen Description BLOOD RIGHT ARM   Final    Special Requests BOTTLES DRAWN AEROBIC AND ANAEROBIC   Final    Setup Time [id number]   Final    Culture     Final    Value:        BLOOD CULTURE RECEIVED NO GROWTH TO DATE CULTURE WILL BE HELD FOR 5 DAYS BEFORE ISSUING A FINAL NEGATIVE REPORT   Report Status PENDING    Incomplete   CULTURE, BLOOD (ROUTINE X 2)     Status: Normal (Preliminary result)   Collection Time   06/26/11  3:20 PM      Component Value Range Status Comment   Specimen Description BLOOD RIGHT ARM   Final    Special Requests BOTTLES DRAWN AEROBIC ONLY   Final    Setup Time [id number]   Final    Culture     Final    Value:        BLOOD CULTURE RECEIVED NO GROWTH TO DATE CULTURE WILL BE HELD FOR 5 DAYS BEFORE ISSUING A FINAL NEGATIVE REPORT   Report Status PENDING   Incomplete   CULTURE, SPUTUM-ASSESSMENT     Status: Normal   Collection Time   06/28/11  8:16 AM      Component Value Range Status Comment   Specimen Description SPUTUM   Final    Special Requests NONE   Final    Sputum evaluation     Final    Value: THIS SPECIMEN IS ACCEPTABLE. RESPIRATORY CULTURE REPORT TO FOLLOW.   Report Status 06/28/2011 FINAL   Final     Studies/Results: Dg Chest 2 View  06/26/2011  *RADIOLOGY REPORT*  Clinical Data: Productive cough. Shortness of breath.  CHEST - 2 VIEW  Comparison: None.  Findings: Consolidation/infiltrate right middle lobe and right lung base.  Recommend follow-up until clearance.  Cardiomegaly and pulmonary vascular congestion/mild congestive heart failure.  No gross pneumothorax.  Tortuous aorta.  IMPRESSION: Consolidation/infiltrate right middle lobe and right lung base. Recommend follow-up until clearance.  Cardiomegaly and pulmonary vascular congestion/mild congestive heart failure.  Tortuous aorta.  Original Report Authenticated By: Fuller Canada, M.D.    Medications: I have reviewed the patient's current medications. Scheduled Meds:   . albuterol  2.5 mg Nebulization TID  . enoxaparin (LOVENOX) injection  40 mg Subcutaneous Q24H  . guaiFENesin  600 mg Oral BID  . levofloxacin (LEVAQUIN) IV  500 mg Intravenous Q24H  . potassium chloride  20 mEq Oral Daily  . zolpidem  5 mg Oral Once  . DISCONTD: albuterol  2.5 mg Nebulization Q6H   Continuous Infusions:    . 0.9 % NaCl with KCl 40 mEq / L 75 mL/hr at 06/28/11 1716   PRN Meds:.albuterol, zolpidem Assessment/Plan: Patient Active Hospital Problem List: Pneumonia (06/26/2011)   Assessment: Patient appears clinically improved.    Plan: Continue Levaquin   Hypokalemia (06/26/2011)   Assessment: Repleted    Leukocytosis (06/27/2011)   Assessment: Improving.expect resolution with treatment of pneumonia.    Hypoxemia Improving. continue to wean oxygen   LOS: 2 days

## 2011-06-28 NOTE — Progress Notes (Signed)
Physical Therapy Evaluation Patient Details Name: Rhonda Tapia MRN: [id number] DOB: 1926/03/25 Today's Date: 06/28/2011 Time: 911-930 Charge: EVII  Problem List:  Patient Active Problem List  Diagnoses  . Pneumonia  . Hypokalemia  . Leukocytosis    Past Medical History:  Past Medical History  Diagnosis Date  . Pneumonia 06/26/11    currently  . No pertinent past medical history     pt denies any problems   Past Surgical History:  Past Surgical History  Procedure Date  . Hemoroidectomy     PT Assessment/Plan/Recommendation PT Assessment Clinical Impression Statement: Pt would benefit from skilled PT services in order to increase indpendence with transfers and ambulation by improving strength and endurance to prepare for D/C home alone.  Pt educated in pursed lip breathing technique. Pt reports she only uses her basement for laundry and she will have someone come over to do that if she does not feel confidence with stairs.  Encouraged pt to ambulate with staff (to manage equipment and monitor SaO2). PT Recommendation/Assessment: Patient will need skilled PT in the acute care venue PT Problem List: Decreased strength;Decreased activity tolerance;Decreased mobility;Cardiopulmonary status limiting activity PT Therapy Diagnosis : Difficulty walking PT Plan PT Frequency: Min 3X/week PT Treatment/Interventions: DME instruction;Gait training;Stair training;Functional mobility training;Therapeutic activities;Therapeutic exercise;Patient/family education PT Recommendation Follow Up Recommendations: None Equipment Recommended: None recommended by PT PT Goals  Acute Rehab PT Goals PT Goal Formulation: With patient Time For Goal Achievement: 7 days Pt will go Supine/Side to Sit: with modified independence;with HOB 0 degrees PT Goal: Supine/Side to Sit - Progress: Not met Pt will go Sit to Stand: with modified independence PT Goal: Sit to Stand - Progress: Not met Pt will  Ambulate: >150 feet;with modified independence (SaO2 >92%) PT Goal: Ambulate - Progress: Not met Pt will Go Up / Down Stairs: 1-2 stairs;with rail(s);with modified independence PT Goal: Up/Down Stairs - Progress: Not met Pt will Perform Home Exercise Program: with supervision, verbal cues required/provided PT Goal: Perform Home Exercise Program - Progress: Not met  PT Evaluation Precautions/Restrictions  Restrictions Weight Bearing Restrictions: No Prior Functioning  Home Living Lives With: Alone Type of Home: House Home Layout: Two level;Able to live on main level with bedroom/bathroom (basement once a week for laundry) Alternate Level Stairs-Rails: None Alternate Level Stairs-Number of Steps: 8 Home Access: Stairs to enter Entrance Stairs-Rails: Can reach both Entrance Stairs-Number of Steps: 2 Home Adaptive Equipment: None Prior Function Level of Independence: Independent with basic ADLs;Independent with gait Driving: Yes Cognition Cognition Arousal/Alertness: Awake/alert Overall Cognitive Status: Appears within functional limits for tasks assessed Orientation Level: Oriented X4 Sensation/Coordination Sensation Light Touch: Appears Intact Extremity Assessment RLE Assessment RLE Assessment: Exceptions to [facility] RLE Strength Right Hip Flexion:  (4-) LLE Assessment LLE Assessment: Exceptions to [facility] LLE Strength Left Hip Flexion:  (4-) Mobility (including Balance) Bed Mobility Bed Mobility: No Transfers Transfers: Yes Sit to Stand: 4: Min assist;From chair/3-in-1;From toilet Sit to Stand Details (indicate cue type and reason): min/guard, verbal cue for use of armrests Stand to Sit: To toilet;To chair/3-in-1;4: Min assist Stand to Sit Details: min/guard, verbal cue for armrests Ambulation/Gait Ambulation/Gait: Yes Ambulation/Gait Assistance: 4: Min assist Ambulation/Gait Assistance Details (indicate cue type and reason): min/guard, pt reaching for hand rail a couple  times upon beginning gait but then reported she felt more steady after a short distance and did not need UEs, no LOB observed Ambulation Distance (Feet): 200 Feet Assistive device: None Gait Pattern: Step-through pattern Gait velocity: slow cadence  Balance Balance Assessed: Yes Static Standing Balance Static Standing - Balance Support: No upper extremity supported Static Standing - Level of Assistance: 5: Stand by assistance Static Standing - Comment/# of Minutes: provided hip and trunk perturbations and pt able to maintain balance, did not observe stepping strategy but hip and ankle strategies present Exercise    End of Session PT - End of Session Equipment Utilized During Treatment: Gait belt Activity Tolerance: Patient tolerated treatment well Patient left: in chair;with call bell in reach Nurse Communication: Mobility status for ambulation General Behavior During Session: [facility] for tasks performed Cognition: [facility] for tasks performed  Gerard Cantara,KATHrine E 06/28/2011, 11:35 AM Pager: [phone]

## 2011-06-29 LAB — DIFFERENTIAL
Basophils Absolute: 0 10*3/uL (ref 0.0–0.1)
Lymphocytes Relative: 7 % — ABNORMAL LOW (ref 12–46)
Lymphs Abs: 0.8 10*3/uL (ref 0.7–4.0)
Monocytes Absolute: 1.1 10*3/uL — ABNORMAL HIGH (ref 0.1–1.0)
Monocytes Relative: 10 % (ref 3–12)
Neutro Abs: 9.5 10*3/uL — ABNORMAL HIGH (ref 1.7–7.7)

## 2011-06-29 LAB — CBC
HCT: 34.2 % — ABNORMAL LOW (ref 36.0–46.0)
Hemoglobin: 10.9 g/dL — ABNORMAL LOW (ref 12.0–15.0)
MCV: 94.2 fL (ref 78.0–100.0)
RBC: 3.63 MIL/uL — ABNORMAL LOW (ref 3.87–5.11)
WBC: 11.5 10*3/uL — ABNORMAL HIGH (ref 4.0–10.5)

## 2011-06-29 LAB — BASIC METABOLIC PANEL
CO2: 31 mEq/L (ref 19–32)
Chloride: 100 mEq/L (ref 96–112)
GFR calc Af Amer: >90 mL/min (ref >90)
Potassium: 4.7 mEq/L (ref 3.5–5.1)

## 2011-06-29 MED ORDER — LEVOFLOXACIN 500 MG PO TABS
500.0000 mg | ORAL_TABLET | Freq: Every day | ORAL | Status: DC
  Administered 2011-06-29 – 2011-06-30 (×2): 500 mg via ORAL
  Filled 2011-06-29 (×3): qty 1

## 2011-06-29 NOTE — Progress Notes (Signed)
Pt  O2 sat while resting is 85% on RA. Reapplied  Nasal cannula 2L with O2 sat of 90%. Will cont to monitor.

## 2011-06-29 NOTE — Progress Notes (Signed)
Subjective: Patient states that she feels much better today. He feels that she does not need her oxygen. She states that she's been working on the incentive spirometer and doing reasonably well. The patient wants to get up and walk around. I've instructed her that she should do that only with assistance Objective: Filed Vitals:   06/28/11 2100 06/29/11 0503 06/29/11 0925 06/29/11 1300  BP: 128/66 160/69  147/71  Pulse: 88 88  93  Temp: 98 F (36.7 C) 97.7 F (36.5 C)  97.9 F (36.6 C)  TempSrc: Oral Oral  Oral  Resp: 24 24  24   Height:      Weight:      SpO2: 94% 90% 92% 93%   Weight change:   Intake/Output Summary (Last 24 hours) at 06/29/11 1429 Last data filed at 06/29/11 1300  Gross per 24 hour  Intake   1720 ml  Output   1575 ml  Net    145 ml    General: Alert, awake, oriented x3, in no acute distress. Normal work of breathing HEENT: [place]/AT PEERL, EOMI Neck: Trachea midline,  no masses, no thyromegal,y no JVD, no carotid bruit OROPHARYNX:  Moist, No exudate/ erythema/lesions.  Heart: Regular rate and rhythm, without murmurs, rubs, gallops, PMI non-displaced, no heaves or thrills on palpation.  Lungs: Clear to auscultation, no wheezing or rhonchi noted. No increased vocal fremitus resonant to percussion  Abdomen: Soft, nontender, nondistended, positive bowel sounds, no masses no hepatosplenomegaly noted..  Neuro: No focal neurological deficits noted cranial nerves II through XII grossly intact. DTRs 2+ bilaterally upper and lower extremities. Strength functional in bilateral upper and lower extremities. Musculoskeletal: No warm swelling or erythema around joints, no spinal tenderness noted. Psychiatric: Patient alert and oriented x3, good insight and cognition, good recent to remote recall. Lymph node survey: No cervical axillary or inguinal lymphadenopathy noted.   Lab Results:  Basename 06/29/11 0420 06/28/11 0435 06/26/11 2030  NA 135 137 --  K 4.7 4.5 --  CL 100  103 --  CO2 31 30 --  GLUCOSE 116* 107* --  BUN 8 9 --  CREATININE 0.56 0.51 --  CALCIUM 8.8 8.6 --  MG -- -- 2.2  PHOS -- -- --    Basename 06/26/11 1520  AST 207*  ALT 145*  ALKPHOS 174*  BILITOT 0.3  PROT 6.2  ALBUMIN 2.1*   No results found for this basename: LIPASE:2,AMYLASE:2 in the last 72 hours  Basename 06/29/11 0420 06/28/11 0435  WBC 11.5* 12.3*  NEUTROABS 9.5* 10.2*  HGB 10.9* 10.9*  HCT 34.2* 34.6*  MCV 94.2 95.6  PLT 341 310    Basename 06/26/11 1520  CKTOTAL 27  CKMB 1.6  CKMBINDEX --  TROPONINI <0.30   No components found with this basename: POCBNP:3  Basename 06/26/11 1520  DDIMER 2.91*   No results found for this basename: HGBA1C:2 in the last 72 hours No results found for this basename: CHOL:2,HDL:2,LDLCALC:2,TRIG:2,CHOLHDL:2,LDLDIRECT:2 in the last 72 hours No results found for this basename: TSH,T4TOTAL,FREET3,T3FREE,THYROIDAB in the last 72 hours No results found for this basename: VITAMINB12:2,FOLATE:2,FERRITIN:2,TIBC:2,IRON:2,RETICCTPCT:2 in the last 72 hours  Micro Results: Recent Results (from the past 240 hour(s))  URINE CULTURE     Status: Normal   Collection Time   06/26/11  2:46 PM      Component Value Range Status Comment   Specimen Description URINE, CLEAN CATCH   Final    Special Requests NONE   Final    Setup Time [phone]  Final    Colony Count >=100,000 COLONIES/ML   Final    Culture     Final    Value: Multiple bacterial morphotypes present, none predominant. Suggest appropriate recollection if clinically indicated.   Report Status 06/27/2011 FINAL   Final   CULTURE, BLOOD (ROUTINE X 2)     Status: Normal (Preliminary result)   Collection Time   06/26/11  3:15 PM      Component Value Range Status Comment   Specimen Description BLOOD RIGHT ARM   Final    Special Requests BOTTLES DRAWN AEROBIC AND ANAEROBIC   Final    Setup Time 201212122311   Final    Culture     Final    Value:        BLOOD CULTURE  RECEIVED NO GROWTH TO DATE CULTURE WILL BE HELD FOR 5 DAYS BEFORE ISSUING A FINAL NEGATIVE REPORT   Report Status PENDING   Incomplete   CULTURE, BLOOD (ROUTINE X 2)     Status: Normal (Preliminary result)   Collection Time   06/26/11  3:20 PM      Component Value Range Status Comment   Specimen Description BLOOD RIGHT ARM   Final    Special Requests BOTTLES DRAWN AEROBIC ONLY   Final    Setup Time [id number]   Final    Culture     Final    Value:        BLOOD CULTURE RECEIVED NO GROWTH TO DATE CULTURE WILL BE HELD FOR 5 DAYS BEFORE ISSUING A FINAL NEGATIVE REPORT   Report Status PENDING   Incomplete   CULTURE, SPUTUM-ASSESSMENT     Status: Normal   Collection Time   06/28/11  8:16 AM      Component Value Range Status Comment   Specimen Description SPUTUM   Final    Special Requests NONE   Final    Sputum evaluation     Final    Value: THIS SPECIMEN IS ACCEPTABLE. RESPIRATORY CULTURE REPORT TO FOLLOW.   Report Status 06/28/2011 FINAL   Final   CULTURE, RESPIRATORY     Status: Normal (Preliminary result)   Collection Time   06/28/11  8:16 AM      Component Value Range Status Comment   Specimen Description SPUTUM   Final    Special Requests NONE   Final    Gram Stain     Final    Value: MODERATE WBC PRESENT,BOTH PMN AND MONONUCLEAR     FEW SQUAMOUS EPITHELIAL CELLS PRESENT     FEW GRAM POSITIVE RODS     FEW GRAM NEGATIVE RODS     FEW GRAM POSITIVE COCCI   Culture NORMAL OROPHARYNGEAL FLORA   Final    Report Status PENDING   Incomplete     Studies/Results: Dg Chest 2 View  06/26/2011  *RADIOLOGY REPORT*  Clinical Data: Productive cough. Shortness of breath.  CHEST - 2 VIEW  Comparison: None.  Findings: Consolidation/infiltrate right middle lobe and right lung base.  Recommend follow-up until clearance.  Cardiomegaly and pulmonary vascular congestion/mild congestive heart failure.  No gross pneumothorax.  Tortuous aorta.  IMPRESSION: Consolidation/infiltrate right middle  lobe and right lung base. Recommend follow-up until clearance.  Cardiomegaly and pulmonary vascular congestion/mild congestive heart failure.  Tortuous aorta.  Original Report Authenticated By: Fuller Canada, M.D.    Medications: I have reviewed the patient's current medications. Scheduled Meds:    . albuterol  2.5 mg Nebulization TID  . enoxaparin (LOVENOX)  injection  40 mg Subcutaneous Q24H  . guaiFENesin  600 mg Oral BID  . levofloxacin  500 mg Oral Daily  . potassium chloride  20 mEq Oral Daily  . DISCONTD: levofloxacin (LEVAQUIN) IV  500 mg Intravenous Q24H   Continuous Infusions:    . DISCONTD: 0.9 % NaCl with KCl 40 mEq / L 75 mL/hr at 06/28/11 1716   PRN Meds:.albuterol, zolpidem Assessment/Plan: Patient Active Hospital Problem List: Pneumonia (06/26/2011)   Assessment: Patient appears clinically improved.    Plan: Change to by mouth Levaquin   Hypokalemia (06/26/2011)   Assessment: Repleted    Leukocytosis (06/27/2011)   Assessment: Improving.expect resolution with treatment of pneumonia.    Hypoxemia Improving. continue to wean oxygen. Check O2 saturations on room air and ambulatory.  Anticipate discharge likely tomorrow on by mouth Levaquin.   LOS: 3 days

## 2011-06-30 LAB — CBC
HCT: 37.5 % (ref 36.0–46.0)
Hemoglobin: 11.9 g/dL — ABNORMAL LOW (ref 12.0–15.0)
MCH: 29.9 pg (ref 26.0–34.0)
MCHC: 31.7 g/dL (ref 30.0–36.0)
MCV: 94.2 fL (ref 78.0–100.0)

## 2011-06-30 LAB — DIFFERENTIAL
Basophils Relative: 0 % (ref 0–1)
Eosinophils Relative: 1 % (ref 0–5)
Monocytes Absolute: 0.4 10*3/uL (ref 0.1–1.0)
Monocytes Relative: 4 % (ref 3–12)
Neutro Abs: 8.4 10*3/uL — ABNORMAL HIGH (ref 1.7–7.7)

## 2011-06-30 LAB — CULTURE, RESPIRATORY W GRAM STAIN: Culture: NORMAL

## 2011-06-30 MED ORDER — ZOLPIDEM TARTRATE 5 MG PO TABS: 5.0000 mg | ORAL_TABLET | Freq: Every evening | ORAL | Status: DC | PRN

## 2011-06-30 MED ORDER — LEVOFLOXACIN 500 MG PO TABS: 500.0000 mg | ORAL_TABLET | Freq: Every day | ORAL | Status: AC

## 2011-06-30 MED ORDER — POTASSIUM CHLORIDE CRYS ER 20 MEQ PO TBCR: 20.0000 meq | EXTENDED_RELEASE_TABLET | Freq: Two times a day (BID) | ORAL | Status: DC

## 2011-06-30 MED ORDER — GUAIFENESIN ER 600 MG PO TB12: 600.0000 mg | ORAL_TABLET | Freq: Two times a day (BID) | ORAL | Status: AC

## 2011-06-30 NOTE — Progress Notes (Signed)
  Pt O2 sat 84-85% on RA while resting. Nasal cannula reapllied on 3L with O2 sat of 90-91%.

## 2011-06-30 NOTE — Discharge Summary (Signed)
Rhonda Tapia MRN: [id number] DOB/AGE: 75-May-1927 75 y.o.  Admit date: 06/26/2011 Discharge date: 06/30/2011  Primary Care Physician:  Tomi Bamberger, NP, NP   Discharge Diagnoses:   Patient Active Problem List  Diagnoses  . Pneumonia  . Hypokalemia  . Leukocytosis    DISCHARGE MEDICATION: Current Discharge Medication List    START taking these medications   Details  guaiFENesin (MUCINEX) 600 MG 12 hr tablet Take 1 tablet (600 mg total) by mouth 2 (two) times daily. Qty: 60 tablet, Refills: 0    levofloxacin (LEVAQUIN) 500 MG tablet Take 1 tablet (500 mg total) by mouth daily. Qty: 4 tablet, Refills: 0    potassium chloride SA (K-DUR,KLOR-CON) 20 MEQ tablet Take 1 tablet (20 mEq total) by mouth 2 (two) times daily. Qty: 60 tablet, Refills: 0    zolpidem (AMBIEN) 5 MG tablet Take 1 tablet (5 mg total) by mouth at bedtime as needed for sleep. Qty: 30 tablet, Refills: 0      STOP taking these medications     clarithromycin (BIAXIN) 500 MG tablet            Consults: Treatment Team:  Rhonda Irish, MD   SIGNIFICANT DIAGNOSTIC STUDIES:  Dg Chest 2 View  06/26/2011  *RADIOLOGY REPORT*  Clinical Data: Productive cough. Shortness of breath.  CHEST - 2 VIEW  Comparison: None.  Findings: Consolidation/infiltrate right middle lobe and right lung base.  Recommend follow-up until clearance.  Cardiomegaly and pulmonary vascular congestion/mild congestive heart failure.  No gross pneumothorax.  Tortuous aorta.  IMPRESSION: Consolidation/infiltrate right middle lobe and right lung base. Recommend follow-up until clearance.  Cardiomegaly and pulmonary vascular congestion/mild congestive heart failure.  Tortuous aorta.  Original Report Authenticated By: Fuller Canada, M.D.      Recent Results (from the past 240 hour(s))  URINE CULTURE     Status: Normal   Collection Time   06/26/11  2:46 PM      Component Value Range Status Comment   Specimen Description URINE, CLEAN  CATCH   Final    Special Requests NONE   Final    Setup Time 201212122233   Final    Colony Count >=100,000 COLONIES/ML   Final    Culture     Final    Value: Multiple bacterial morphotypes present, none predominant. Suggest appropriate recollection if clinically indicated.   Report Status 06/27/2011 FINAL   Final   CULTURE, BLOOD (ROUTINE X 2)     Status: Normal (Preliminary result)   Collection Time   06/26/11  3:15 PM      Component Value Range Status Comment   Specimen Description BLOOD RIGHT ARM   Final    Special Requests BOTTLES DRAWN AEROBIC AND ANAEROBIC   Final    Setup Time [id number]   Final    Culture     Final    Value:        BLOOD CULTURE RECEIVED NO GROWTH TO DATE CULTURE WILL BE HELD FOR 5 DAYS BEFORE ISSUING A FINAL NEGATIVE REPORT   Report Status PENDING   Incomplete   CULTURE, BLOOD (ROUTINE X 2)     Status: Normal (Preliminary result)   Collection Time   06/26/11  3:20 PM      Component Value Range Status Comment   Specimen Description BLOOD RIGHT ARM   Final    Special Requests BOTTLES DRAWN AEROBIC ONLY   Final    Setup Time [id number]   Final    Culture  Final    Value:        BLOOD CULTURE RECEIVED NO GROWTH TO DATE CULTURE WILL BE HELD FOR 5 DAYS BEFORE ISSUING A FINAL NEGATIVE REPORT   Report Status PENDING   Incomplete   CULTURE, SPUTUM-ASSESSMENT     Status: Normal   Collection Time   06/28/11  8:16 AM      Component Value Range Status Comment   Specimen Description SPUTUM   Final    Special Requests NONE   Final    Sputum evaluation     Final    Value: THIS SPECIMEN IS ACCEPTABLE. RESPIRATORY CULTURE REPORT TO FOLLOW.   Report Status 06/28/2011 FINAL   Final   CULTURE, RESPIRATORY     Status: Normal   Collection Time   06/28/11  8:16 AM      Component Value Range Status Comment   Specimen Description SPUTUM   Final    Special Requests NONE   Final    Gram Stain     Final    Value: MODERATE WBC PRESENT,BOTH PMN AND MONONUCLEAR      FEW SQUAMOUS EPITHELIAL CELLS PRESENT     FEW GRAM POSITIVE RODS     FEW GRAM NEGATIVE RODS     FEW GRAM POSITIVE COCCI   Culture NORMAL OROPHARYNGEAL FLORA   Final    Report Status 06/30/2011 FINAL   Final     BRIEF ADMITTING H & P: Independent, active 77 yo with no medical problems presents with 1 week severe daytime sleepiness out of the blue. No energy. No appetite. Some wheezing and mild shortness of breath. No chest pain. No sig. cough    Hospital Course:  Present on Admission:  .Pneumonia: Patient is admitted with a community-acquired pneumonia both by clinical examination and supported by radiologic findings. She was initially significantly hypoxic requiring 6 L of oxygen. The patient is now down to 2 L of oxygen but still requiring oxygen at rest. She's been afebrile for the past 48 hours. She's had no escalation her white blood cell count. In her work of breathing has been normal. The patient is receiving albuterol treatments on a when necessary basis but really has had no evidence of wheezing or reactive airway disease. The patient is being discharged on oxygen 2 L in 1 around-the-clock. I recommended that she felt that her primary care provider Tomi Bamberger nurse practitioners in 3 days to reevaluate for her oxygen supplementation needs. If she still require a this time it would be prudent to perform a chest x-ray and followup to assess the patient's lung architecture.   .Hypokalemia: The patient in addition was found to be significantly hypokalemic with a potassium of 2.8. This was replaced both IV and orally. The patient since has been able to maintain her potassium but oral intake. The patient is being discharged home on supplemental potassium I would recommend that she have followup labs done with her nurse practitioner on the next visit to evaluate the potassium levels.  .Leukocytosis: Rhonda Tapia a leukocytosis on admission of 16.7. This is felt to be associated with her  pneumonia. At the time of discharge patient's white blood cell count is 9.8.  Disposition and Follow-up:  patient to followup with Patient Tomi Bamberger within 3 days.  DISCHARGE EXAM:  General: Alert, awake, oriented x3, in no acute distress. Normal work of breathing Blood pressure 132/74, pulse 83, temperature 97.9 F (36.6 C), temperature source Oral, resp. rate 18, height 4' 11.5" (1.511 m), weight 58.968  kg (130 lb), SpO2 91.00%. HEENT: [place]/AT PEERL, EOMI  Neck: Trachea midline, no masses, no thyromegal,y no JVD, no carotid bruit  OROPHARYNX: Moist, No exudate/ erythema/lesions.  Heart: Regular rate and rhythm, without murmurs, rubs, gallops, PMI non-displaced, no heaves or thrills on palpation.  Lungs: Clear to auscultation, no wheezing or rhonchi noted. No increased vocal fremitus resonant to percussion  Abdomen: Soft, nontender, nondistended, positive bowel sounds, no masses no hepatosplenomegaly noted..  Neuro: No focal neurological deficits noted cranial nerves II through XII grossly intact. DTRs 2+ bilaterally upper and lower extremities. Strength functional in bilateral upper and lower extremities.  Musculoskeletal: No warm swelling or erythema around joints, no spinal tenderness noted.  Psychiatric: Patient alert and oriented x3, good insight and cognition, good recent to remote recall.  Lymph node survey: No cervical axillary or inguinal lymphadenopathy noted. Condition on discharge is stable.   Basename 06/29/11 0420 06/28/11 0435  NA 135 137  K 4.7 4.5  CL 100 103  CO2 31 30  GLUCOSE 116* 107*  BUN 8 9  CREATININE 0.56 0.51  CALCIUM 8.8 8.6  MG -- --  PHOS -- --   No results found for this basename: AST:2,ALT:2,ALKPHOS:2,BILITOT:2,PROT:2,ALBUMIN:2 in the last 72 hours No results found for this basename: LIPASE:2,AMYLASE:2 in the last 72 hours  Basename 06/30/11 0945 06/29/11 0420  WBC 9.8 11.5*  NEUTROABS 8.4* 9.5*  HGB 11.9* 10.9*  HCT 37.5 34.2*  MCV 94.2  94.2  PLT 331 341   Total time of discharge process including face-to-face time approximately 40 minutes. Signed: Conya Ellinwood A. 06/30/2011, 2:44 PM

## 2011-06-30 NOTE — Progress Notes (Signed)
CM consulted per MD order to d/c pt on home oxygen. Per pt choice [facility] to provide DME. DME to be delivered to room prior to patient d/c. Leonie Green [phone]

## 2011-07-02 LAB — CULTURE, BLOOD (ROUTINE X 2)
Culture  Setup Time: 201212122311
Culture: NO GROWTH
Culture: NO GROWTH

## 2013-07-26 ENCOUNTER — Encounter: Payer: Self-pay | Admitting: Family Medicine

## 2013-07-26 ENCOUNTER — Ambulatory Visit (INDEPENDENT_AMBULATORY_CARE_PROVIDER_SITE_OTHER): Payer: Medicare HMO | Admitting: Family Medicine

## 2013-07-26 VITALS — BP 128/82 | HR 80 | Temp 98.3°F | Resp 18 | Ht 59.5 in | Wt 126.0 lb

## 2013-07-26 DIAGNOSIS — Z0000 Encounter for general adult medical examination without abnormal findings: Secondary | ICD-10-CM

## 2013-07-26 DIAGNOSIS — Z23 Encounter for immunization: Secondary | ICD-10-CM

## 2013-07-26 HISTORY — DX: Encounter for general adult medical examination without abnormal findings: Z00.00

## 2013-07-26 LAB — COMPREHENSIVE METABOLIC PANEL
ALBUMIN: 4.1 g/dL (ref 3.5–5.2)
ALT: 9 U/L (ref 0–35)
AST: 14 U/L (ref 0–37)
Alkaline Phosphatase: 33 U/L — ABNORMAL LOW (ref 39–117)
BUN: 11 mg/dL (ref 6–23)
CALCIUM: 9.2 mg/dL (ref 8.4–10.5)
CHLORIDE: 100 meq/L (ref 96–112)
CO2: 31 meq/L (ref 19–32)
CREATININE: 0.62 mg/dL (ref 0.50–1.10)
Glucose, Bld: 87 mg/dL (ref 70–99)
POTASSIUM: 4.6 meq/L (ref 3.5–5.3)
Sodium: 141 mEq/L (ref 135–145)
TOTAL PROTEIN: 6.7 g/dL (ref 6.0–8.3)
Total Bilirubin: 0.4 mg/dL (ref 0.3–1.2)

## 2013-07-26 LAB — LIPID PANEL
CHOLESTEROL: 224 mg/dL — AB (ref 0–200)
HDL: 61 mg/dL (ref >39)
LDL Cholesterol: 141 mg/dL — ABNORMAL HIGH (ref 0–99)
Total CHOL/HDL Ratio: 3.7 Ratio
Triglycerides: 111 mg/dL (ref <150)
VLDL: 22 mg/dL (ref 0–40)

## 2013-07-26 NOTE — Patient Instructions (Signed)
We Will call with lab results Prevnar 13 given Prescription for shingles vaccine given F/U as needed or in 1 year

## 2013-07-26 NOTE — Progress Notes (Signed)
   Subjective:    Patient ID: Rhonda Tapia, female    DOB: 13-Jan-1926, 80 y.o.   MRN: [id number]  HPI  Patient here to establish care. She was last seen by Delia Chimes nurse practitioner about 3 years ago. She does not follow with any specialist. She's been fairly healthy all her life and she's not on any prescription medications. She was admitted and treated for pneumonia back in 2012 but has not had any difficulty since then. Medications and history reviewed. Occasionally she states the Tylenol for a joint pain. She also uses over-the-counter allergy medicine as needed  Flu shot up-to-date Pneumovax up to date per report No shingles vaccine She's had a colonoscopy in the distant past which was normal She no longer obtains mammograms No concerns.   Lives alone, has family/friends looking after her    Review of Systems  GEN- denies fatigue, fever, weight loss,weakness, recent illness HEENT- denies eye drainage, change in vision, nasal discharge, CVS- denies chest pain, palpitations RESP- denies SOB, cough, wheeze ABD- denies N/V, change in stools, abd pain GU- denies dysuria, hematuria, dribbling, incontinence MSK- denies joint pain, muscle aches, injury Neuro- denies headache, dizziness, syncope, seizure activity      Objective:   Physical Exam  GEN- NAD, alert and oriented x3 HEENT- PERRL, EOMI, non injected sclera, pink conjunctiva, MMM, oropharynx clear Neck- Supple, no LAD CVS- RRR, no murmur RESP-CTAB ABD-NABS, soft, NT<ND EXT- No edema Pulses- Radial 2+ PSYCH-normal affect and mood       Assessment & Plan:

## 2013-07-26 NOTE — Assessment & Plan Note (Signed)
Patient here to establish care. Baseline labs were obtained. She was given Prevnar 13. Given a prescription for shingles vaccine which she can obtain from the pharmacy Followup as needed Based on her age I see no reason to do any preventative screening

## 2013-07-27 LAB — CBC WITH DIFFERENTIAL/PLATELET
BASOS ABS: 0 10*3/uL (ref 0.0–0.1)
Basophils Relative: 0 % (ref 0–1)
EOS PCT: 1 % (ref 0–5)
Eosinophils Absolute: 0 10*3/uL (ref 0.0–0.7)
HEMATOCRIT: 42 % (ref 36.0–46.0)
Hemoglobin: 14.1 g/dL (ref 12.0–15.0)
LYMPHS ABS: 1.1 10*3/uL (ref 0.7–4.0)
LYMPHS PCT: 20 % (ref 12–46)
MCH: 30.6 pg (ref 26.0–34.0)
MCHC: 33.6 g/dL (ref 30.0–36.0)
MCV: 91.1 fL (ref 78.0–100.0)
Monocytes Absolute: 0.4 10*3/uL (ref 0.1–1.0)
Monocytes Relative: 6 % (ref 3–12)
NEUTROS ABS: 4.1 10*3/uL (ref 1.7–7.7)
Neutrophils Relative %: 73 % (ref 43–77)
PLATELETS: 268 10*3/uL (ref 150–400)
RBC: 4.61 MIL/uL (ref 3.87–5.11)
RDW: 13.6 % (ref 11.5–15.5)
WBC: 5.6 10*3/uL (ref 4.0–10.5)

## 2013-12-07 ENCOUNTER — Ambulatory Visit (INDEPENDENT_AMBULATORY_CARE_PROVIDER_SITE_OTHER): Payer: Medicare HMO | Admitting: Family Medicine

## 2013-12-07 ENCOUNTER — Encounter: Payer: Self-pay | Admitting: Family Medicine

## 2013-12-07 VITALS — BP 140/82 | HR 78 | Temp 97.6°F | Resp 18 | Ht 59.5 in | Wt 125.0 lb

## 2013-12-07 DIAGNOSIS — J45909 Unspecified asthma, uncomplicated: Secondary | ICD-10-CM

## 2013-12-07 MED ORDER — ALBUTEROL SULFATE HFA 108 (90 BASE) MCG/ACT IN AERS: 2.0000 | INHALATION_SPRAY | Freq: Four times a day (QID) | RESPIRATORY_TRACT | Status: DC | PRN

## 2013-12-07 MED ORDER — PREDNISONE 20 MG PO TABS: ORAL_TABLET | ORAL | Status: DC

## 2013-12-07 NOTE — Progress Notes (Signed)
   Subjective:    Patient ID: Rhonda Tapia, female    DOB: Aug 26, 1925, 80 y.o.   MRN: [id number]  HPI Patient was in her normal state of good health until one week ago when she was spraying a pesticide.  She inhaled a significant quantity of the vapor and immediately became short of breath. This persisted the remainder of the week. She is also coughing. She reports dyspnea on exertion. She denies any fevers or chills. On examination today of her lungs she has diminished breath sounds bilaterally with expiratory wheezes right greater than left.  She denies any hemoptysis. The cough is nonproductive. She denies any angina or chest pain. She denies any pleurisy. Past Medical History  Diagnosis Date  . Pneumonia 06/26/11    currently  . No pertinent past medical history     pt denies any problems   Current Outpatient Prescriptions on File Prior to Visit  Medication Sig Dispense Refill  . acetaminophen (TYLENOL) 500 MG tablet Take 500 mg by mouth every 6 (six) hours as needed.      . cetirizine (ZYRTEC) 10 MG tablet Take 10 mg by mouth daily.       No current facility-administered medications on file prior to visit.   Allergies  Allergen Reactions  . Penicillins Hives   History   Social History  . Marital Status: Widowed    Spouse Name: N/A    Number of Children: N/A  . Years of Education: N/A   Occupational History  . Not on file.   Social History Main Topics  . Smoking status: Never Smoker   . Smokeless tobacco: Never Used  . Alcohol Use: No  . Drug Use: No  . Sexual Activity: No   Other Topics Concern  . Not on file   Social History Narrative  . No narrative on file      Review of Systems     Objective:   Physical Exam  Vitals reviewed. Neck: Neck supple. No JVD present.  Cardiovascular: Normal rate, regular rhythm and normal heart sounds.  Exam reveals no gallop and no friction rub.   No murmur heard. Pulmonary/Chest: Effort normal. No respiratory  distress. She has decreased breath sounds in the right upper field, the right lower field, the left upper field and the left lower field. She has wheezes in the right upper field and the right lower field.  Lymphadenopathy:    She has no cervical adenopathy.          Assessment & Plan:  1. Reactive airway disease I believe the patient is having reactive airway disease due to a chemical irritant. I will treat the patient with a prednisone taper pack and albuterol 2 puffs inhaled every 6 hours as needed for wheezing. Recheck immediately if worse or in 48 hours if no better.  Go to emergency room immediately if she develops chest pain.   - predniSONE (DELTASONE) 20 MG tablet; 3 tabs poqday 1-2, 2 tabs poqday 3-4, 1 tab poqday 5-6  Dispense: 12 tablet; Refill: 0 - albuterol (PROVENTIL HFA;VENTOLIN HFA) 108 (90 BASE) MCG/ACT inhaler; Inhale 2 puffs into the lungs every 6 (six) hours as needed for wheezing or shortness of breath.  Dispense: 1 Inhaler; Refill: 0

## 2014-07-27 ENCOUNTER — Ambulatory Visit (INDEPENDENT_AMBULATORY_CARE_PROVIDER_SITE_OTHER): Payer: Medicare HMO | Admitting: Family Medicine

## 2014-07-27 ENCOUNTER — Encounter: Payer: Self-pay | Admitting: Family Medicine

## 2014-07-27 ENCOUNTER — Ambulatory Visit: Payer: Medicare HMO | Admitting: Family Medicine

## 2014-07-27 VITALS — BP 132/74 | HR 80 | Temp 98.4°F | Resp 16 | Ht 60.0 in | Wt 118.0 lb

## 2014-07-27 DIAGNOSIS — J209 Acute bronchitis, unspecified: Secondary | ICD-10-CM

## 2014-07-27 MED ORDER — LEVOFLOXACIN 500 MG PO TABS: 500.0000 mg | ORAL_TABLET | Freq: Every day | ORAL | Status: DC

## 2014-07-27 NOTE — Patient Instructions (Signed)
Take the antibiotics as prescribed Use robitussin DM for cough  Use nasal saline F/U as needed

## 2014-07-27 NOTE — Progress Notes (Signed)
Patient ID: Rhonda Tapia, female   DOB: 04-Aug-1925, 81 y.o.   MRN: [id number]   Subjective:    Patient ID: Rhonda Tapia, female    DOB: [birth year], 81 y.o.   MRN: [id number]  Patient presents for Cough  patient here with worsening cough with mild production for the past 2 weeks. She's not had any fever or chills no wheezing no shortness of breath. She tried taking Mucinex DM but this made her feel little sick on his stomach and made her a little woozy. She states that she feels fairly good of the exception of the cough. She's here today with a family friend    Review Of Systems:  GEN- denies fatigue, fever, weight loss,weakness, recent illness HEENT- denies eye drainage, change in vision, +nasal discharge, CVS- denies chest pain, palpitations RESP- denies SOB,+ cough, wheeze ABD- denies N/V, change in stools, abd pain Neuro- denies headache, dizziness, syncope, seizure activity       Objective:    BP 132/74 mmHg  Pulse 80  Temp(Src) 98.4 F (36.9 C) (Oral)  Resp 16  Ht 5' (1.524 m)  Wt 118 lb (53.524 kg)  BMI 23.05 kg/m2  SpO2 93% GEN- NAD, alert and oriented x3 HEENT- PERRL, EOMI, non injected sclera, pink conjunctiva, MMM, oropharynx mild injection, TM clear bilat no effusion,  No  maxillary sinus tenderness, + clear Nasal drainage  Neck- Supple, no LAD CVS- RRR, no murmur RESP-course BS, mild rales left side, decreased BS bilat bases, no wheezing EXT- No edema Pulses- Radial 2+         Assessment & Plan:      Problem List Items Addressed This Visit    None    Visit Diagnoses    Acute bronchitis, unspecified organism    -  Primary    Worried she may be developing a walking PNA, no fever, chills or other symptoms to suggest at this time. Based on exam and age, h/o PNA, treat Levaquin, robitussin DM, CXR if not better       Note: This dictation was prepared with Dragon dictation along with smaller phrase technology. Any transcriptional errors that result  from this process are unintentional.

## 2016-06-12 ENCOUNTER — Ambulatory Visit (INDEPENDENT_AMBULATORY_CARE_PROVIDER_SITE_OTHER): Payer: Medicare HMO | Admitting: Family Medicine

## 2016-06-12 ENCOUNTER — Encounter: Payer: Self-pay | Admitting: Family Medicine

## 2016-06-12 VITALS — BP 134/70 | HR 76 | Temp 97.7°F | Resp 16 | Ht 59.5 in | Wt 121.0 lb

## 2016-06-12 DIAGNOSIS — F5101 Primary insomnia: Secondary | ICD-10-CM

## 2016-06-12 DIAGNOSIS — H6122 Impacted cerumen, left ear: Secondary | ICD-10-CM | POA: Diagnosis not present

## 2016-06-12 DIAGNOSIS — Z0000 Encounter for general adult medical examination without abnormal findings: Secondary | ICD-10-CM | POA: Diagnosis not present

## 2016-06-12 DIAGNOSIS — G4700 Insomnia, unspecified: Secondary | ICD-10-CM | POA: Insufficient documentation

## 2016-06-12 MED ORDER — ALPRAZOLAM 0.25 MG PO TABS: 0.2500 mg | ORAL_TABLET | Freq: Every evening | ORAL | 1 refills | Status: DC | PRN

## 2016-06-12 NOTE — Progress Notes (Signed)
Subjective:   Patient presents for Medicare Annual/Subsequent preventive examination.  Issue year for a wellness exam. She does not wonder any immunizations she is currently 82 years old there for past preventative care.  She has been having difficulty sleeping she feels anxious especially at nighttime. She was given alprazolam 0.25 mg in the past and by a family member and this helped calm. She slept well. She did not have any drowsiness the next day she would like to have some of this on hand. She's also tried melatonin low doses this helps some but she still feels anxious.   Review Past Medical/Family/Social: Per EMR    Risk Factors  Current exercise habits: stays active  Dietary issues discussed: None   Cardiac risk factors: None   Depression Screen  (Note: if answer to either of the following is "Yes", a more complete depression screening is indicated)  Over the past two weeks, have you felt down, depressed or hopeless? No Over the past two weeks, have you felt little interest or pleasure in doing things? No Have you lost interest or pleasure in daily life? No Do you often feel hopeless? No Do you cry easily over simple problems? No   Activities of Daily Living  In your present state of health, do you have any difficulty performing the following activities?:  Driving? No  Managing money? No  Feeding yourself? No  Getting from bed to chair? No  Climbing a flight of stairs? No  Preparing food and eating?: No  Bathing or showering? No  Getting dressed: No  Getting to the toilet? No  Using the toilet:No  Moving around from place to place: No  In the past year have you fallen or had a near fall?:yes- tripped on storm door no injury  Are you sexually active? No  Do you have more than one partner? No   Hearing Difficulties: No  Do you often ask people to speak up or repeat themselves? No  Do you experience ringing or noises in your ears? No Do you have difficulty  understanding soft or whispered voices? No  Do you feel that you have a problem with memory? No Do you often misplace items? No  Do you feel safe at home? Yes  Cognitive Testing  Alert? Yes Normal Appearance?Yes  Oriented to person? Yes Place? Yes  Time? Yes  Recall of three objects? Yes  Can perform simple calculations? Yes  Displays appropriate judgment?Yes  Can read the correct time from a watch face?Yes   List the Names of Other Physician/Practitioners you currently use:  None  Screening Tests / Date  - DECLINES  Colonoscopy                     Zostavax  Mammogram  Influenza Vaccine  Tetanus/tdap  ROS: GEN- denies fatigue, fever, weight loss,weakness, recent illness HEENT- denies eye drainage, change in vision, nasal discharge, CVS- denies chest pain, palpitations RESP- denies SOB, cough, wheeze ABD- denies N/V, change in stools, abd pain GU- denies dysuria, hematuria, dribbling, incontinence MSK- denies joint pain, muscle aches, injury Neuro- denies headache, dizziness, syncope, seizure activity  Physical GEN- NAD, alert and oriented x3 HEENT- PERRL, EOMI, non injected sclera, pink conjunctiva, MMM, oropharynx clear, TM clear right, wax impaction left side,  Neck- Supple, no thryomegaly CVS- RRR, no murmur RESP-CTAB ABD-NABS,soft,NT,ND Psych- normal affect and mood, very pleasant  EXT- No edema Pulses- Radial, DP- 2+   Assessment:    Annual wellness medicare exam  Plan:    During the course of the visit the patient was educated and counseled about appropriate screening and preventive services including:  Declines preventative meds  Labs done  Cerumen removed at bedside with irrigation   Screen neg for depression, though she has some mild anxiety and insomnia, will try the xanax 0.25mg  she  Rarely takes medication, 1 script may last quite some time   Advised the low dose of melatonin can also be used  Diet review for nutrition referral? Yes ____ Not  Indicated __x__  Patient Instructions (the written plan) was given to the patient.  Medicare Attestation  I have personally reviewed:  The patient's medical and social history  Their use of alcohol, tobacco or illicit drugs  Their current medications and supplements  The patient's functional ability including ADLs,fall risks, home safety risks, cognitive, and hearing and visual impairment  Diet and physical activities  Evidence for depression or mood disorders  The patient's weight, height, BMI, and visual acuity have been recorded in the chart. I have made referrals, counseling, and provided education to the patient based on review of the above and I have provided the patient with a written personalized care plan for preventive services.

## 2016-06-12 NOTE — Patient Instructions (Signed)
We will call with lab results  Try the xanax at bedtime as needed F/U 1 year for physical

## 2018-01-26 ENCOUNTER — Ambulatory Visit (INDEPENDENT_AMBULATORY_CARE_PROVIDER_SITE_OTHER): Payer: PPO | Admitting: Family Medicine

## 2018-01-26 ENCOUNTER — Ambulatory Visit
Admission: RE | Admit: 2018-01-26 | Discharge: 2018-01-26 | Disposition: A | Discharge status: Home / Self Care | Payer: PPO | Source: Ambulatory Visit | Attending: Family Medicine | Admitting: Family Medicine

## 2018-01-26 ENCOUNTER — Other Ambulatory Visit: Payer: Self-pay

## 2018-01-26 ENCOUNTER — Encounter: Payer: Self-pay | Admitting: Family Medicine

## 2018-01-26 VITALS — BP 142/68 | HR 88 | Temp 98.1°F | Resp 16 | Ht 59.5 in | Wt 121.0 lb

## 2018-01-26 DIAGNOSIS — N200 Calculus of kidney: Secondary | ICD-10-CM | POA: Diagnosis not present

## 2018-01-26 DIAGNOSIS — R319 Hematuria, unspecified: Secondary | ICD-10-CM | POA: Diagnosis not present

## 2018-01-26 DIAGNOSIS — N3001 Acute cystitis with hematuria: Secondary | ICD-10-CM

## 2018-01-26 LAB — COMPREHENSIVE METABOLIC PANEL
AG RATIO: 1.8 (calc) (ref 1.0–2.5)
ALBUMIN MSPROF: 4.2 g/dL (ref 3.6–5.1)
ALKALINE PHOSPHATASE (APISO): 43 U/L (ref 33–130)
ALT: 9 U/L (ref 6–29)
AST: 16 U/L (ref 10–35)
BILIRUBIN TOTAL: 0.7 mg/dL (ref 0.2–1.2)
BUN/Creatinine Ratio: 18 (calc) (ref 6–22)
BUN: 18 mg/dL (ref 7–25)
CALCIUM: 9.1 mg/dL (ref 8.6–10.4)
CHLORIDE: 93 mmol/L — AB (ref 98–110)
CO2: 28 mmol/L (ref 20–32)
CREATININE: 1.01 mg/dL — AB (ref 0.60–0.88)
GLOBULIN: 2.4 g/dL (ref 1.9–3.7)
Glucose, Bld: 115 mg/dL — ABNORMAL HIGH (ref 65–99)
POTASSIUM: 4.8 mmol/L (ref 3.5–5.3)
Sodium: 129 mmol/L — ABNORMAL LOW (ref 135–146)
Total Protein: 6.6 g/dL (ref 6.1–8.1)

## 2018-01-26 LAB — CBC WITH DIFFERENTIAL/PLATELET
Basophils Absolute: 29 cells/uL (ref 0–200)
Basophils Relative: 0.2 %
Eosinophils Absolute: 14 cells/uL — ABNORMAL LOW (ref 15–500)
Eosinophils Relative: 0.1 %
HCT: 38 % (ref 35.0–45.0)
Hemoglobin: 13.2 g/dL (ref 11.7–15.5)
Lymphs Abs: 518 cells/uL — ABNORMAL LOW (ref 850–3900)
MCH: 30.3 pg (ref 27.0–33.0)
MCHC: 34.7 g/dL (ref 32.0–36.0)
MCV: 87.4 fL (ref 80.0–100.0)
MONOS PCT: 7.9 %
MPV: 10.1 fL (ref 7.5–12.5)
NEUTROS PCT: 88.2 %
Neutro Abs: 12701 cells/uL — ABNORMAL HIGH (ref 1500–7800)
PLATELETS: 263 10*3/uL (ref 140–400)
RBC: 4.35 10*6/uL (ref 3.80–5.10)
RDW: 12.2 % (ref 11.0–15.0)
TOTAL LYMPHOCYTE: 3.6 %
WBC: 14.4 10*3/uL — ABNORMAL HIGH (ref 3.8–10.8)
WBCMIX: 1138 {cells}/uL — AB (ref 200–950)

## 2018-01-26 LAB — URINALYSIS, ROUTINE W REFLEX MICROSCOPIC
BILIRUBIN URINE: NEGATIVE
GLUCOSE, UA: NEGATIVE
Nitrite: NEGATIVE
PH: 6 (ref 5.0–8.0)
SPECIFIC GRAVITY, URINE: 1.02 (ref 1.001–1.03)

## 2018-01-26 LAB — MICROSCOPIC MESSAGE

## 2018-01-26 MED ORDER — TAMSULOSIN HCL 0.4 MG PO CAPS: 0.4000 mg | ORAL_CAPSULE | Freq: Every day | ORAL | 0 refills | Status: DC

## 2018-01-26 MED ORDER — ALPRAZOLAM 0.25 MG PO TABS: 0.2500 mg | ORAL_TABLET | Freq: Every evening | ORAL | 1 refills | Status: DC | PRN

## 2018-01-26 MED ORDER — TRAMADOL HCL 50 MG PO TABS: 50.0000 mg | ORAL_TABLET | Freq: Three times a day (TID) | ORAL | 0 refills | Status: DC | PRN

## 2018-01-26 MED ORDER — CEPHALEXIN 500 MG PO CAPS: 500.0000 mg | ORAL_CAPSULE | Freq: Two times a day (BID) | ORAL | 0 refills | Status: DC

## 2018-01-26 NOTE — Progress Notes (Signed)
   Subjective:    Patient ID: Rhonda Tapia, female    DOB: [birth year], 84 y.o.   MRN: [id number]  Patient presents for Hematuria (x1 day- L flank pain, blood in urine)   Started with left lower quandrant pain and had blood in urine. Had intermittnat flank. No Nausea/Vomiting. Was feeling well until then   Bowels recently have been normal , occ takes suppository , no blood in stool  Has had decreased appetite,   This morning slipped in the bath tub, onto bottom, and hit elbows Taking tylenol for pain Today pain much better, no gross blood seen    Request refill on xanax, uses sparingly for anxiety a tbedtime   Review Of Systems:  GEN- denies fatigue, fever, weight loss,weakness, recent illness HEENT- denies eye drainage, change in vision, nasal discharge, CVS- denies chest pain, palpitations RESP- denies SOB, cough, wheeze ABD- denies N/V, change in stools, abd pain GU- denies dysuria,+ hematuria, dribbling, incontinence MSK- denies joint pain, muscle aches, injury Neuro- denies headache, dizziness, syncope, seizure activity       Objective:    BP (!) 142/68   Pulse 88   Temp 98.1 F (36.7 C) (Oral)   Resp 16   Ht 4' 11.5" (1.511 m)   Wt 121 lb (54.9 kg)   SpO2 96%   BMI 24.03 kg/m  GEN- NAD, alert and oriented x3, non toxic appearing HEENT- PERRL, EOMI, non injected sclera, pink conjunctiva, MMM, oropharynx clear Neck- Supple, no thyromegaly CVS- RRR, no murmur RESP-CTAB ABD-NABS,soft,mild TTP LLQ, no rebound, no guarding, no CVA tenderness EXT- No edema Pulses- Radial 2+        Assessment & Plan:      Problem List Items Addressed This Visit    None    Visit Diagnoses    Hematuria, unspecified type    -  Primary   Will start treatment for cysitis, send for culture. KUB shows left kidney stone, flomax x 2 weeks, ultram for pain, check labs   Relevant Orders   Urinalysis, Routine w reflex microscopic (Completed)   Urine Culture   Comprehensive  metabolic panel   CBC with Differential/Platelet   DG Abd 1 View (Completed)   Acute cystitis with hematuria       discussed hydration   Kidney stone on left side       Relevant Medications   traMADol (ULTRAM) 50 MG tablet      Note: This dictation was prepared with Dragon dictation along with smaller phrase technology. Any transcriptional errors that result from this process are unintentional.

## 2018-01-26 NOTE — Patient Instructions (Addendum)
Start antibiotics  Get the Xray done  Call us if not improving  [place] for pain

## 2018-01-27 LAB — URINE CULTURE
MICRO NUMBER:: 90834827
SPECIMEN QUALITY:: ADEQUATE

## 2018-01-30 ENCOUNTER — Encounter: Payer: Self-pay | Admitting: Family Medicine

## 2018-01-30 ENCOUNTER — Other Ambulatory Visit: Payer: Self-pay

## 2018-01-30 ENCOUNTER — Ambulatory Visit (INDEPENDENT_AMBULATORY_CARE_PROVIDER_SITE_OTHER): Payer: PPO | Admitting: Family Medicine

## 2018-01-30 VITALS — BP 138/78 | HR 70 | Temp 97.9°F | Resp 16 | Ht 59.5 in | Wt 119.0 lb

## 2018-01-30 DIAGNOSIS — N200 Calculus of kidney: Secondary | ICD-10-CM

## 2018-01-30 DIAGNOSIS — D72829 Elevated white blood cell count, unspecified: Secondary | ICD-10-CM | POA: Diagnosis not present

## 2018-01-30 DIAGNOSIS — K5900 Constipation, unspecified: Secondary | ICD-10-CM

## 2018-01-30 LAB — CBC WITH DIFFERENTIAL/PLATELET
BASOS PCT: 0.2 %
Basophils Absolute: 17 cells/uL (ref 0–200)
Eosinophils Absolute: 17 cells/uL (ref 15–500)
Eosinophils Relative: 0.2 %
HCT: 35.9 % (ref 35.0–45.0)
Hemoglobin: 12.4 g/dL (ref 11.7–15.5)
Lymphs Abs: 697 cells/uL — ABNORMAL LOW (ref 850–3900)
MCH: 30.6 pg (ref 27.0–33.0)
MCHC: 34.5 g/dL (ref 32.0–36.0)
MCV: 88.6 fL (ref 80.0–100.0)
MPV: 10.2 fL (ref 7.5–12.5)
Monocytes Relative: 12.4 %
Neutro Abs: 6715 cells/uL (ref 1500–7800)
Neutrophils Relative %: 79 %
PLATELETS: 259 10*3/uL (ref 140–400)
RBC: 4.05 10*6/uL (ref 3.80–5.10)
RDW: 11.9 % (ref 11.0–15.0)
TOTAL LYMPHOCYTE: 8.2 %
WBC mixed population: 1054 cells/uL — ABNORMAL HIGH (ref 200–950)
WBC: 8.5 10*3/uL (ref 3.8–10.8)

## 2018-01-30 LAB — BASIC METABOLIC PANEL
BUN / CREAT RATIO: 16 (calc) (ref 6–22)
BUN: 15 mg/dL (ref 7–25)
CO2: 28 mmol/L (ref 20–32)
CREATININE: 0.96 mg/dL — AB (ref 0.60–0.88)
Calcium: 8.9 mg/dL (ref 8.6–10.4)
Chloride: 97 mmol/L — ABNORMAL LOW (ref 98–110)
Glucose, Bld: 106 mg/dL — ABNORMAL HIGH (ref 65–99)
Potassium: 4 mmol/L (ref 3.5–5.3)
Sodium: 133 mmol/L — ABNORMAL LOW (ref 135–146)

## 2018-01-30 NOTE — Patient Instructions (Signed)
Complete the flomax  Complete the antibiotics  F/U as needed

## 2018-01-30 NOTE — Progress Notes (Signed)
   Subjective:    Patient ID: Rhonda Tapia, female    DOB: 1926/05/08, 84 y.o.   MRN: [id number]  Patient presents for Follow-up (hematuria/ kidney stone- reports that she has improved some, but had severe pain last night- does report decreased appetite )  Patient here for interim follow-up.  She was seen earlier this week with hematuria flank pain.  Concern was for urinary tract infection and then x-ray was obtained which did show small kidney stone on the left side that he moved into the ureter.  She was started on Flomax as well as Keflex she had leukocytosis of 14,000.  No fever no chills.  She has had some decreased appetite.  States that yesterday she felt something moving down into her groin she had severe pain but then it finally resolved.  She has not noticed any  blood when she is urinated she feels good today.  She has not had any nausea or vomiting.  She has been trying to drink a lot of water she drinks 4 bottles of water plus Gatorade  Also had some constipation states she has not had a bowel movement since last week.  Her x-ray did not show any stool burden  She is here with Rhonda Tapia who helps care for her   Review Of Systems:  GEN- denies fatigue, fever, weight loss,weakness, recent illness HEENT- denies eye drainage, change in vision, nasal discharge, CVS- denies chest pain, palpitations RESP- denies SOB, cough, wheeze ABD- denies N/V, +change in stools, abd pain GU- denies dysuria, hematuria, dribbling, incontinence MSK- denies joint pain, muscle aches, injury Neuro- denies headache, dizziness, syncope, seizure activity       Objective:    BP 138/78   Pulse 70   Temp 97.9 F (36.6 C) (Oral)   Resp 16   Ht 4' 11.5" (1.511 m)   Wt 119 lb (54 kg)   SpO2 98%   BMI 23.63 kg/m  GEN- NAD, alert and oriented x3 HEENT- PERRL, EOMI, non injected sclera, pink conjunctiva, MMM, oropharynx clear CVS- RRR, no murmur RESP-CTAB ABD-NABS,soft,NT,ND, no CVA tenderness ,  no suprapubic tenderness  EXT- No edema Pulses- Radial  2+        Assessment & Plan:      Problem List Items Addressed This Visit    None    Visit Diagnoses    Kidney stone on left side    -  Primary   I think stone has moved into bladder, or possibly passed. but will give total of 14 days on flomax, she also has ultram, finish antibiotics    Relevant Orders   CBC with Differential/Platelet   Basic metabolic panel   Leukocytosis, unspecified type       recheck WBC STAT, she does look much improved today, discussed eating small amounts/ensure or boost , appetite decreased with the pain   Relevant Orders   CBC with Differential/Platelet   Constipation, unspecified constipation type       I think she has verylittle in her bowels due to poor appetite, no significant stool burden on xray, she has MOM/miralax at home she can use      Note: This dictation was prepared with Dragon dictation along with smaller phrase technology. Any transcriptional errors that result from this process are unintentional.

## 2018-09-02 ENCOUNTER — Encounter: Payer: Self-pay | Admitting: Family Medicine

## 2018-09-02 ENCOUNTER — Ambulatory Visit (INDEPENDENT_AMBULATORY_CARE_PROVIDER_SITE_OTHER): Payer: PPO | Admitting: Family Medicine

## 2018-09-02 ENCOUNTER — Other Ambulatory Visit: Payer: Self-pay

## 2018-09-02 VITALS — BP 132/70 | HR 68 | Temp 98.1°F | Resp 16 | Ht 59.5 in | Wt 120.0 lb

## 2018-09-02 DIAGNOSIS — F5101 Primary insomnia: Secondary | ICD-10-CM

## 2018-09-02 DIAGNOSIS — J310 Chronic rhinitis: Secondary | ICD-10-CM | POA: Diagnosis not present

## 2018-09-02 MED ORDER — FLUTICASONE PROPIONATE 50 MCG/ACT NA SUSP: 1.0000 | Freq: Every day | NASAL | 3 refills | Status: DC | PRN

## 2018-09-02 MED ORDER — ALPRAZOLAM 0.25 MG PO TABS: 0.2500 mg | ORAL_TABLET | Freq: Every evening | ORAL | 4 refills | Status: DC | PRN

## 2018-09-02 NOTE — Patient Instructions (Addendum)
Use the flonase once a day in the morning Loratadine once a day  Xanax at bedtime  F/U 1 year or as needed

## 2018-09-02 NOTE — Assessment & Plan Note (Signed)
Add nasal steroid to claritin see how she does  No sign of CSF leak, no headaches associated

## 2018-09-02 NOTE — Progress Notes (Signed)
   Subjective:    Patient ID: Rhonda Tapia, female    DOB: 05/15/1926, 85 y.o.   MRN: [id number]  Patient presents for Insomnia (difficulty falling asleep and staying asleep)   Nose just drips,has tried  loratadine which helps some but continues to drip clear, doesn't feel bad, no cough, occurs when she eats a lot. No sinus pressure or HA  Chronic insomnia, wants to continue her xanax bedtime she takes about 3 times a week, does not want to try any other sleeping medication or pill. States it helps calm her mind at night, when she tends to worry  No falls, eating well, no changes in bowel or bladder    Review Of Systems:  GEN- denies fatigue, fever, weight loss,weakness, recent illness HEENT- denies eye drainage, change in vision, nasal discharge, CVS- denies chest pain, palpitations RESP- denies SOB, cough, wheeze ABD- denies N/V, change in stools, abd pain GU- denies dysuria, hematuria, dribbling, incontinence MSK- denies joint pain, muscle aches, injury Neuro- denies headache, dizziness, syncope, seizure activity       Objective:    BP 132/70   Pulse 68   Temp 98.1 F (36.7 C) (Oral)   Resp 16   Ht 4' 11.5" (1.511 m)   Wt 120 lb (54.4 kg)   SpO2 97%   BMI 23.83 kg/m  GEN- NAD, alert and oriented x3 HEENT- PERRL, EOMI, non injected sclera, pink conjunctiva, MMM, oropharynx clear, nares clear, TM clear no effusion, no maxillary sinus tenderness  Neck- Supple, no thyromegaly CVS- RRR, no murmur RESP-CTAB ABD-NABS,soft,NT,ND Psych-normal affect and mood  EXT- No edema Pulses- Radial 2+        Assessment & Plan:     Pt declines wellness exam, labs Problem List Items Addressed This Visit      Unprioritized   Insomnia - Primary    Chronic insomnia with mild anxiety , refilled xanax, discussed trazodone, pt declines She is on low dose, no side effects with meds, no falls  Very active 85 year old       Rhinitis    Add nasal steroid to claritin see how she  does  No sign of CSF leak, no headaches associated         Note: This dictation was prepared with Dragon dictation along with smaller phrase technology. Any transcriptional errors that result from this process are unintentional.

## 2018-09-02 NOTE — Assessment & Plan Note (Signed)
Chronic insomnia with mild anxiety , refilled xanax, discussed trazodone, pt declines She is on low dose, no side effects with meds, no falls  Very active 84 year old

## 2019-03-29 ENCOUNTER — Other Ambulatory Visit: Payer: Self-pay

## 2019-03-30 ENCOUNTER — Encounter: Payer: Self-pay | Admitting: Family Medicine

## 2019-03-30 ENCOUNTER — Ambulatory Visit (INDEPENDENT_AMBULATORY_CARE_PROVIDER_SITE_OTHER): Payer: PPO | Admitting: Family Medicine

## 2019-03-30 VITALS — BP 128/64 | HR 88 | Temp 98.5°F | Resp 14 | Ht 59.5 in | Wt 124.0 lb

## 2019-03-30 DIAGNOSIS — Z23 Encounter for immunization: Secondary | ICD-10-CM | POA: Diagnosis not present

## 2019-03-30 DIAGNOSIS — E785 Hyperlipidemia, unspecified: Secondary | ICD-10-CM

## 2019-03-30 DIAGNOSIS — Z13220 Encounter for screening for lipoid disorders: Secondary | ICD-10-CM | POA: Diagnosis not present

## 2019-03-30 DIAGNOSIS — F411 Generalized anxiety disorder: Secondary | ICD-10-CM

## 2019-03-30 DIAGNOSIS — F5101 Primary insomnia: Secondary | ICD-10-CM

## 2019-03-30 MED ORDER — TRAZODONE HCL 50 MG PO TABS: 25.0000 mg | ORAL_TABLET | Freq: Every evening | ORAL | 3 refills | Status: DC | PRN

## 2019-03-30 MED ORDER — ALPRAZOLAM 0.25 MG PO TABS: 0.2500 mg | ORAL_TABLET | Freq: Every evening | ORAL | 4 refills | Status: DC | PRN

## 2019-03-30 NOTE — Progress Notes (Signed)
Subjective:    Patient ID: Rhonda Tapia, female    DOB: 03/30/1926, 85 y.o.   MRN: [id number]  Patient presents for Follow-up (is not fasting) and Insomnia (having issues staying asleep)   Pt here to f/u chronic medical problems  She continues to have difficulty sleeping, also has mild anxiety has xanax 0.25mg  she typically takes before bedtime she may sleep about 3 hours and then she is up.  She occasionally takes a nap during the day but just feels tired.   Not been ill recently but did have one fall when she was taking out the trash and slipped now a friend of the family takes out her trash for her.  She did not have any injury during that time. Does admit that she still gets jittery throughout the day and just feels nervous and anxious.  She is willing to try something else for her sleep and nerves.   Also uses allergy meds, flonase and claritin as needed   Due for flu shot    She is due for check on renal function  Review Of Systems:  GEN- denies fatigue, fever, weight loss,weakness, recent illness HEENT- denies eye drainage, change in vision, nasal discharge, CVS- denies chest pain, palpitations RESP- denies SOB, cough, wheeze ABD- denies N/V, change in stools, abd pain GU- denies dysuria, hematuria, dribbling, incontinence MSK- denies joint pain, muscle aches, injury Neuro- denies headache, dizziness, syncope, seizure activity       Objective:    BP 128/64   Pulse 88   Temp 98.5 F (36.9 C) (Oral)   Resp 14   Ht 4' 11.5" (1.511 m)   Wt 124 lb (56.2 kg)   SpO2 96%   BMI 24.63 kg/m  GEN- NAD, alert and oriented x3 HEENT- PERRL, EOMI, non injected sclera, pink conjunctiva, MMM, oropharynx clear Neck- Supple, no thyromegaly CVS- RRR, no murmur RESP-CTAB ABD-NABS,soft,NT,ND Psych- normal affect and mood, very sweet, , normal speech EXT- No edema Pulses- Radial  2+        Assessment & Plan:      Problem List Items Addressed This Visit      Unprioritized   GAD (generalized anxiety disorder)    Generalized anxiety with chronic insomnia.  We will try her on trazodone will start with 25 mg at that time she can still use her alprazolam during the day if needed.  Her caregiver who is a good friend of hers also a patient of mine will check in on her.  Check her labs today.  She does have some history of high cholesterol but with her age minimal comorbidities we have not treated this at this time.      Relevant Medications   traZODone (DESYREL) 50 MG tablet   ALPRAZolam (XANAX) 0.25 MG tablet   Hyperlipidemia   Relevant Orders   CBC with Differential/Platelet (Completed)   Comprehensive metabolic panel (Completed)   Lipid panel (Completed)   Insomnia - Primary   Relevant Orders   CBC with Differential/Platelet (Completed)   Comprehensive metabolic panel (Completed)    Other Visit Diagnoses    Screening cholesterol level       Relevant Orders   Lipid panel (Completed)   Need for immunization against influenza       Relevant Orders   Flu Vaccine QUAD High Dose(Fluad) (Completed)      Note: This dictation was prepared with Dragon dictation along with smaller phrase technology. Any transcriptional errors that result from this process are unintentional.

## 2019-03-30 NOTE — Patient Instructions (Addendum)
F/U 3 weeks Phone visit for meds  Take 1/2 tablet of trazodone 1 hour before bedtime for sleep Okay to use xanax during day if needed

## 2019-03-31 ENCOUNTER — Encounter: Payer: Self-pay | Admitting: Family Medicine

## 2019-03-31 DIAGNOSIS — F411 Generalized anxiety disorder: Secondary | ICD-10-CM | POA: Insufficient documentation

## 2019-03-31 LAB — CBC WITH DIFFERENTIAL/PLATELET
Absolute Monocytes: 549 cells/uL (ref 200–950)
Basophils Absolute: 30 cells/uL (ref 0–200)
Basophils Relative: 0.5 %
Eosinophils Absolute: 59 cells/uL (ref 15–500)
Eosinophils Relative: 1 %
HCT: 39 % (ref 35.0–45.0)
Hemoglobin: 12.8 g/dL (ref 11.7–15.5)
Lymphs Abs: 1109 cells/uL (ref 850–3900)
MCH: 30 pg (ref 27.0–33.0)
MCHC: 32.8 g/dL (ref 32.0–36.0)
MCV: 91.5 fL (ref 80.0–100.0)
MPV: 10.2 fL (ref 7.5–12.5)
Monocytes Relative: 9.3 %
Neutro Abs: 4154 cells/uL (ref 1500–7800)
Neutrophils Relative %: 70.4 %
Platelets: 271 10*3/uL (ref 140–400)
RBC: 4.26 10*6/uL (ref 3.80–5.10)
RDW: 12.2 % (ref 11.0–15.0)
Total Lymphocyte: 18.8 %
WBC: 5.9 10*3/uL (ref 3.8–10.8)

## 2019-03-31 LAB — COMPREHENSIVE METABOLIC PANEL
AG Ratio: 1.4 (calc) (ref 1.0–2.5)
ALT: 10 U/L (ref 6–29)
AST: 16 U/L (ref 10–35)
Albumin: 4 g/dL (ref 3.6–5.1)
Alkaline phosphatase (APISO): 42 U/L (ref 37–153)
BUN: 9 mg/dL (ref 7–25)
CO2: 29 mmol/L (ref 20–32)
Calcium: 9.2 mg/dL (ref 8.6–10.4)
Chloride: 100 mmol/L (ref 98–110)
Creat: 0.64 mg/dL (ref 0.60–0.88)
Globulin: 2.8 g/dL (calc) (ref 1.9–3.7)
Glucose, Bld: 88 mg/dL (ref 65–99)
Potassium: 4.3 mmol/L (ref 3.5–5.3)
Sodium: 138 mmol/L (ref 135–146)
Total Bilirubin: 0.4 mg/dL (ref 0.2–1.2)
Total Protein: 6.8 g/dL (ref 6.1–8.1)

## 2019-03-31 LAB — LIPID PANEL
Cholesterol: 236 mg/dL — ABNORMAL HIGH (ref <200)
HDL: 65 mg/dL (ref >=50)
LDL Cholesterol (Calc): 150 mg/dL (calc) — ABNORMAL HIGH
Non-HDL Cholesterol (Calc): 171 mg/dL (calc) — ABNORMAL HIGH (ref <130)
Total CHOL/HDL Ratio: 3.6 (calc) (ref <5.0)
Triglycerides: 101 mg/dL (ref <150)

## 2019-03-31 NOTE — Assessment & Plan Note (Signed)
Generalized anxiety with chronic insomnia.  We will try her on trazodone will start with 25 mg at that time she can still use her alprazolam during the day if needed.  Her caregiver who is a good friend of hers also a patient of mine will check in on her.  Check her labs today.  She does have some history of high cholesterol but with her age minimal comorbidities we have not treated this at this time.

## 2019-04-20 ENCOUNTER — Ambulatory Visit (INDEPENDENT_AMBULATORY_CARE_PROVIDER_SITE_OTHER): Payer: PPO | Admitting: Family Medicine

## 2019-04-20 ENCOUNTER — Other Ambulatory Visit: Payer: Self-pay

## 2019-04-20 ENCOUNTER — Encounter: Payer: Self-pay | Admitting: Family Medicine

## 2019-04-20 DIAGNOSIS — F5101 Primary insomnia: Secondary | ICD-10-CM | POA: Diagnosis not present

## 2019-04-20 NOTE — Progress Notes (Signed)
Virtual Visit via Telephone Note  I connected with Rhonda Tapia on 04/20/19 at 11:30 AM EDT by telephone and verified that I am speaking with the correct person using two identifiers.       Pt location: at home   Physician location:  In office, [facility], Vic Blackbird MD     On call: patient and physician   I discussed the limitations, risks, security and privacy concerns of performing an evaluation and management service by telephone and the availability of in person appointments. I also discussed with the patient that there may be a patient responsible charge related to this service. The patient expressed understanding and agreed to proceed.   History of Present Illness: Telephone visit to follow-up medications.  Patient was seen about 2 to 3 weeks ago secondary to her chronic insomnia and her known anxiety.  She has alprazolam at home but returned to something different to help her sleep at night.  She was started on trazodone she took 25 mg 1 night but then she could not go to sleep and she became jittery and upset so she took her Xanax and then went to sleep.  She has not tried it since then.  Want to know she could try the medication again to see if it really works.  She did not have any chest pain shortness of breath headache associated she just did not fall asleep an hour later which she thought she was going to.  She is only been taking her Xanax typically once a day when she does take it. Observations/Objective: No acute distress noted over the phone normal speech.  Assessment and Plan: Chronic insomnia recommend she try the trazodone again she can start with another half a tablet for the first night to see if the jitteriness was indeed just due to her being upset that she could go to sleep.  If she is fine has no side effects the next night she can actually go to 50 mg which is a full tablet and she should take this for a week so that we can see if it is actually  helping her sleep or not before trying something different.  She still has her alprazolam on hand to use as needed.  I will have my nurse follow-up with her on Monday to see how she did with the medication.  Follow Up Instructions:    I discussed the assessment and treatment plan with the patient. The patient was provided an opportunity to ask questions and all were answered. The patient agreed with the plan and demonstrated an understanding of the instructions.   The patient was advised to call back or seek an in-person evaluation if the symptoms worsen or if the condition fails to improve as anticipated.  I provided 5 minutes of non-face-to-face time during this encounter. End Time 11:35am  Vic Blackbird, MD

## 2019-04-26 ENCOUNTER — Telehealth: Payer: Self-pay | Admitting: Family Medicine

## 2019-04-26 NOTE — Telephone Encounter (Signed)
Call placed to patient and patient caregiver Remo Lipps made aware.

## 2019-04-26 NOTE — Telephone Encounter (Signed)
-----   Message from Parker Strip, LPN sent at [id number]  2:17 PM EDT ----- Regarding: RE: F/U pt on Monday for trazodone Call placed to patient caregiver, Remo Lipps.   Reports that patient only took one dose this week, but she was unable to sleep. States that she took medication 43min prior to going to bed, but she was still awake at 12am. Also reports that she didn't feel right after taking it. Reports that she took 1/2 nerve pill and slept finally. ----- Message ----- From: Alycia Rossetti, MD Sent: 04/26/2019   1:04 PM EDT To: Eden Lathe Six, LPN Subject: FW: F/U pt on Monday for trazodone             Call pt see how she did with the trazadone over the past week  ----- Message ----- From: Alycia Rossetti, MD Sent: 04/20/2019  11:35 AM EDT To: Alycia Rossetti, MD Subject: F/U pt on Monday for trazodone

## 2019-04-26 NOTE — Telephone Encounter (Signed)
Call pt , I dont want to try anything else right now Just stick with the xanax, see if it continues to help her sleep

## 2019-08-12 ENCOUNTER — Ambulatory Visit: Payer: PPO

## 2019-08-17 ENCOUNTER — Ambulatory Visit: Payer: PPO

## 2019-08-20 ENCOUNTER — Ambulatory Visit: Payer: PPO | Attending: Internal Medicine

## 2019-08-20 DIAGNOSIS — Z23 Encounter for immunization: Secondary | ICD-10-CM | POA: Insufficient documentation

## 2019-08-20 NOTE — Progress Notes (Signed)
   Covid-19 Vaccination Clinic  Name:  Rhonda Tapia    MRN: [id number] DOB: [birth year]  08/20/2019  [name] was observed post Covid-19 immunization for 15 minutes without incidence. She was provided with Vaccine Information Sheet and instruction to access the V-Safe system.   [name] was instructed to call 911 with any severe reactions post vaccine: Marland Kitchen Difficulty breathing  . Swelling of your face and throat  . A fast heartbeat  . A bad rash all over your body  . Dizziness and weakness    Immunizations Administered    Name Date Dose VIS Date Route   Pfizer COVID-19 Vaccine 08/20/2019  5:12 PM 0.3 mL 06/25/2019 Intramuscular   Manufacturer: [place]   Lot: [id number]   [place]: [id number]

## 2019-08-25 ENCOUNTER — Other Ambulatory Visit: Payer: Self-pay | Admitting: *Deleted

## 2019-08-25 MED ORDER — ALPRAZOLAM 0.25 MG PO TABS: 0.2500 mg | ORAL_TABLET | Freq: Every evening | ORAL | 4 refills | Status: DC | PRN

## 2019-08-25 NOTE — Telephone Encounter (Signed)
Received call from patient caregiver, Remo Lipps. [phone]~ telephone.   Requested refill on Xanax 0.25mg .   Ok to refill??  Last office visit 04/20/2019.  Last refill 03/30/2019, #4 refills.

## 2019-09-15 ENCOUNTER — Ambulatory Visit: Payer: PPO | Attending: Internal Medicine

## 2019-09-15 DIAGNOSIS — Z23 Encounter for immunization: Secondary | ICD-10-CM | POA: Insufficient documentation

## 2019-09-15 NOTE — Progress Notes (Signed)
   Covid-19 Vaccination Clinic  Name:  KOLETTE NEARING    MRN: [id number] DOB: [birth year]  09/15/2019  Ms. Muffoletto was observed post Covid-19 immunization for 15 minutes without incident. She was provided with Vaccine Information Sheet and instruction to access the V-Safe system.   Ms. Fall was instructed to call 911 with any severe reactions post vaccine: Marland Kitchen Difficulty breathing  . Swelling of face and throat  . A fast heartbeat  . A bad rash all over body  . Dizziness and weakness   Immunizations Administered    Name Date Dose VIS Date Route   Pfizer COVID-19 Vaccine 09/15/2019 12:56 PM 0.3 mL 06/25/2019 Intramuscular   Manufacturer: [place]   Lot: [id number]   [place]: [id number]

## 2020-01-04 DIAGNOSIS — Z85828 Personal history of other malignant neoplasm of skin: Secondary | ICD-10-CM | POA: Diagnosis not present

## 2020-01-04 DIAGNOSIS — L570 Actinic keratosis: Secondary | ICD-10-CM | POA: Diagnosis not present

## 2020-01-04 DIAGNOSIS — C44319 Basal cell carcinoma of skin of other parts of face: Secondary | ICD-10-CM | POA: Diagnosis not present

## 2020-01-04 DIAGNOSIS — C44399 Other specified malignant neoplasm of skin of other parts of face: Secondary | ICD-10-CM | POA: Diagnosis not present

## 2020-01-04 DIAGNOSIS — C44329 Squamous cell carcinoma of skin of other parts of face: Secondary | ICD-10-CM | POA: Diagnosis not present

## 2020-01-20 ENCOUNTER — Other Ambulatory Visit: Payer: Self-pay | Admitting: *Deleted

## 2020-01-20 NOTE — Telephone Encounter (Signed)
Received call from patient caregiver, Remo Lipps.   Reports that patient requires refill on Xanax. States that she knows she is due for an appointment at this time, but she is unwilling to leave home at this time.   Ok to refill??  Last office visit 04/25/2020.  Last refill 08/25/2019, #4 refills.

## 2020-01-21 MED ORDER — ALPRAZOLAM 0.25 MG PO TABS: 0.2500 mg | ORAL_TABLET | Freq: Every evening | ORAL | 2 refills | Status: DC | PRN

## 2020-02-17 DIAGNOSIS — Z85828 Personal history of other malignant neoplasm of skin: Secondary | ICD-10-CM | POA: Diagnosis not present

## 2020-02-17 DIAGNOSIS — L570 Actinic keratosis: Secondary | ICD-10-CM | POA: Diagnosis not present

## 2020-02-17 DIAGNOSIS — L680 Hirsutism: Secondary | ICD-10-CM | POA: Diagnosis not present

## 2020-02-17 DIAGNOSIS — C44319 Basal cell carcinoma of skin of other parts of face: Secondary | ICD-10-CM | POA: Diagnosis not present

## 2020-02-29 DIAGNOSIS — Z85828 Personal history of other malignant neoplasm of skin: Secondary | ICD-10-CM | POA: Diagnosis not present

## 2020-02-29 DIAGNOSIS — C44319 Basal cell carcinoma of skin of other parts of face: Secondary | ICD-10-CM | POA: Diagnosis not present

## 2020-02-29 DIAGNOSIS — C44329 Squamous cell carcinoma of skin of other parts of face: Secondary | ICD-10-CM | POA: Diagnosis not present

## 2020-04-26 ENCOUNTER — Other Ambulatory Visit: Payer: Self-pay

## 2020-04-26 ENCOUNTER — Encounter: Payer: Self-pay | Admitting: Family Medicine

## 2020-04-26 ENCOUNTER — Ambulatory Visit (INDEPENDENT_AMBULATORY_CARE_PROVIDER_SITE_OTHER): Payer: PPO | Admitting: Family Medicine

## 2020-04-26 VITALS — BP 138/80 | HR 92 | Temp 97.8°F | Resp 12 | Ht 60.0 in | Wt 133.0 lb

## 2020-04-26 DIAGNOSIS — Z23 Encounter for immunization: Secondary | ICD-10-CM

## 2020-04-26 DIAGNOSIS — Z0001 Encounter for general adult medical examination with abnormal findings: Secondary | ICD-10-CM

## 2020-04-26 DIAGNOSIS — J310 Chronic rhinitis: Secondary | ICD-10-CM | POA: Diagnosis not present

## 2020-04-26 DIAGNOSIS — F5101 Primary insomnia: Secondary | ICD-10-CM | POA: Diagnosis not present

## 2020-04-26 DIAGNOSIS — F411 Generalized anxiety disorder: Secondary | ICD-10-CM | POA: Diagnosis not present

## 2020-04-26 DIAGNOSIS — R0609 Other forms of dyspnea: Secondary | ICD-10-CM

## 2020-04-26 DIAGNOSIS — R0600 Dyspnea, unspecified: Secondary | ICD-10-CM | POA: Diagnosis not present

## 2020-04-26 DIAGNOSIS — E785 Hyperlipidemia, unspecified: Secondary | ICD-10-CM | POA: Diagnosis not present

## 2020-04-26 DIAGNOSIS — Z0000 Encounter for general adult medical examination without abnormal findings: Secondary | ICD-10-CM

## 2020-04-26 LAB — CBC WITH DIFFERENTIAL/PLATELET
Eosinophils Relative: 1.7 %
MCH: 30.2 pg (ref 27.0–33.0)
MCHC: 32.7 g/dL (ref 32.0–36.0)
Neutrophils Relative %: 67.5 %
Platelets: 262 10*3/uL (ref 140–400)
WBC: 5.4 10*3/uL (ref 3.8–10.8)

## 2020-04-26 MED ORDER — ALPRAZOLAM 0.25 MG PO TABS: 0.2500 mg | ORAL_TABLET | Freq: Every evening | ORAL | 3 refills | Status: DC | PRN

## 2020-04-26 MED ORDER — FLUTICASONE PROPIONATE 50 MCG/ACT NA SUSP: 1.0000 | Freq: Every day | NASAL | 3 refills | Status: DC | PRN

## 2020-04-26 NOTE — Progress Notes (Signed)
Subjective:   Patient presents for Medicare Annual/Subsequent preventive examination.      She has felt more SOB over the past year or so. When he exerts herself.  They describe going to the grocery store she often has to sit down or lean on the cart.  She denies any chest pain which she is having episodes no palpitations.  She does not have any cough or congestion.  She does have chronic allergies and had a runny nose intermittently for many years.  Takes her Claritin but does not use the Flonase.   Due for flu shot     Chronic insomnia- she is no longer on trazodone had some hallunciations  taking xanax 0.25mg  at bedtime    She had BCC  X 2 on face- Dr. Sarajane Jews did surgery   Here with family friend Remo Lipps Apple who cares for her   Review Past Medical/Family/Social: per EMR    Risk Factors  Current exercise habits: none  Dietary issues discussed: good appetite, maintaining weight   Cardiac risk factors: Hyperlipidemia   Depression Screen  (Note: if answer to either of the following is "Yes", a more complete depression screening is indicated)  Over the past two weeks, have you felt down, depressed or hopeless? No Over the past two weeks, have you felt little interest or pleasure in doing things? No Have you lost interest or pleasure in daily life? No Do you often feel hopeless? No Do you cry easily over simple problems? No   Activities of Daily Living  In your present state of health, do you have any difficulty performing the following activities?:  Driving?Yes  Managing money? No  Feeding yourself? No  Getting from bed to chair? No  Climbing a flight of stairs? Yes Preparing food and eating?: No  Bathing or showering? No  Getting dressed: No  Getting to the toilet? No  Using the toilet:No  Moving around from place to place: No  In the past year have you fallen or had a near fall?:No    Hearing Difficulties: Yes  Do you often ask people to speak up or repeat  themselves? Yes   Do you experience ringing or noises in your ears? No Do you have difficulty understanding soft or whispered voices? No  Do you feel that you have a problem with memory? No Do you often misplace items? No  Do you feel safe at home? Yes  Cognitive Testing  Alert? Yes Normal Appearance?Yes  Oriented to person? Yes Place? Yes  Time? Yes  Recall of three objects? Yes  Can perform simple calculations? Yes  Displays appropriate judgment?Yes  Can read the correct time from a watch face?Yes   List the Names of Other Physician/Practitioners you currently use:  Dermatology   Screening Tests / Date COVID Vaccine UTD PNA UTD Flu shot due  ROS:  GEN- denies fatigue, fever, weight loss,weakness, recent illness HEENT- denies eye drainage, change in vision, nasal discharge, CVS- denies chest pain, palpitations RESP- + SOB,  Denies cough, wheeze ABD- denies N/V, change in stools, abd pain GU- denies dysuria, hematuria, dribbling, incontinence MSK- denies joint pain, muscle aches, injury Neuro- denies headache, dizziness, syncope, seizure activity  Vitals reviewed   GEN- NAD, alert and oriented x3 HEENT- PERRL, EOMI, non injected sclera, pink conjunctiva, MMM, oropharynx clear,swelling beneath right eye near recent skin cancer incision Neck- Supple, no thryomegaly CVS- RRR, no murmur RESP-CTAB ABD-NABS,soft,nt,nd  Psych normal affect and mood very pleasant  EXT- No edema Pulses-  Radial, DP- 2+    Assessment:    Annual wellness medicare exam   Plan:    During the course of the visit the patient was educated and counseled about appropriate screening and preventive services including:  Patient continues to live alone with family friends looking in on her.  Fall/depression/AUDIT-C screening negative  Shot given today  Has some dyspnea on exertion which I think is mostly age-related however she could have underlying heart failure or lung pathology.  She declines  work-up for this.  She did agree to get a BNP drawn her blood work today.  I want to proceed with chest x-ray 2D echo she does not want to do this at this time.  I think that this is reasonable.  Her oxygen saturation is normal.  She is fine when she sits  -Insomnia/anxiety she continues alprazolam 0.25 mg at bedtime this works well for her.  Allergies continue Claritin add in Flonase as needed   F/U DERMATOLOGY for swelling near incision, does not appear to be infected, present for the past few weeks             Diet review for nutrition referral? Yes ____ Not Indicated __x__  Patient Instructions (the written plan) was given to the patient.  Medicare Attestation  I have personally reviewed:  The patient's medical and social history  Their use of alcohol, tobacco or illicit drugs  Their current medications and supplements  The patient's functional ability including ADLs,fall risks, home safety risks, cognitive, and hearing and visual impairment  Diet and physical activities  Evidence for depression or mood disorders  The patient's weight, height, BMI, and visual acuity have been recorded in the chart. I have made referrals, counseling, and provided education to the patient based on review of the above and I have provided the patient with a written personalized care plan for preventive services.

## 2020-04-26 NOTE — Patient Instructions (Addendum)
F/U 1year or as needed Continue the claritin, try the flonsae for drainage

## 2020-04-27 LAB — COMPREHENSIVE METABOLIC PANEL
AG Ratio: 1.4 (calc) (ref 1.0–2.5)
ALT: 13 U/L (ref 6–29)
AST: 16 U/L (ref 10–35)
Albumin: 4 g/dL (ref 3.6–5.1)
Alkaline phosphatase (APISO): 38 U/L (ref 37–153)
BUN: 10 mg/dL (ref 7–25)
CO2: 29 mmol/L (ref 20–32)
Calcium: 9.3 mg/dL (ref 8.6–10.4)
Chloride: 100 mmol/L (ref 98–110)
Creat: 0.63 mg/dL (ref 0.60–0.88)
Globulin: 2.9 g/dL (calc) (ref 1.9–3.7)
Glucose, Bld: 87 mg/dL (ref 65–99)
Potassium: 4.4 mmol/L (ref 3.5–5.3)
Sodium: 137 mmol/L (ref 135–146)
Total Bilirubin: 0.4 mg/dL (ref 0.2–1.2)
Total Protein: 6.9 g/dL (ref 6.1–8.1)

## 2020-04-27 LAB — LIPID PANEL
Cholesterol: 232 mg/dL — ABNORMAL HIGH (ref <200)
HDL: 66 mg/dL (ref >=50)
LDL Cholesterol (Calc): 140 mg/dL (calc) — ABNORMAL HIGH
Non-HDL Cholesterol (Calc): 166 mg/dL (calc) — ABNORMAL HIGH (ref <130)
Total CHOL/HDL Ratio: 3.5 (calc) (ref <5.0)
Triglycerides: 137 mg/dL (ref <150)

## 2020-04-27 LAB — CBC WITH DIFFERENTIAL/PLATELET
Absolute Monocytes: 497 cells/uL (ref 200–950)
Basophils Absolute: 32 cells/uL (ref 0–200)
Basophils Relative: 0.6 %
Eosinophils Absolute: 92 cells/uL (ref 15–500)
HCT: 40.1 % (ref 35.0–45.0)
Hemoglobin: 13.1 g/dL (ref 11.7–15.5)
Lymphs Abs: 1134 cells/uL (ref 850–3900)
MCV: 92.4 fL (ref 80.0–100.0)
MPV: 10.5 fL (ref 7.5–12.5)
Monocytes Relative: 9.2 %
Neutro Abs: 3645 cells/uL (ref 1500–7800)
RBC: 4.34 10*6/uL (ref 3.80–5.10)
RDW: 12.2 % (ref 11.0–15.0)
Total Lymphocyte: 21 %

## 2020-04-27 LAB — BRAIN NATRIURETIC PEPTIDE: Brain Natriuretic Peptide: 48 pg/mL (ref <100)

## 2020-06-23 ENCOUNTER — Other Ambulatory Visit: Payer: Self-pay | Admitting: Family Medicine

## 2020-08-16 ENCOUNTER — Other Ambulatory Visit: Payer: Self-pay | Admitting: *Deleted

## 2020-08-16 MED ORDER — ALPRAZOLAM 0.25 MG PO TABS: 0.2500 mg | ORAL_TABLET | Freq: Every evening | ORAL | 3 refills | Status: DC | PRN

## 2020-08-16 NOTE — Telephone Encounter (Signed)
Received call from patient.   Ok to refill??  Last office visit/ refill 04/26/2020, #3 refills.

## 2020-12-21 ENCOUNTER — Other Ambulatory Visit: Payer: Self-pay

## 2020-12-21 ENCOUNTER — Telehealth: Payer: Self-pay | Admitting: Nurse Practitioner

## 2020-12-21 MED ORDER — ALPRAZOLAM 0.25 MG PO TABS: 0.2500 mg | ORAL_TABLET | Freq: Every evening | ORAL | 0 refills | Status: DC | PRN

## 2020-12-21 NOTE — Progress Notes (Signed)
Left message on patient's vmail to request call back.

## 2020-12-21 NOTE — Progress Notes (Signed)
PDMP reviewed. Appears this medication is taken chronically.  Will refill today.  Patient will need appt with me prior to further refills being given.

## 2020-12-21 NOTE — Telephone Encounter (Signed)
Left message on patient's voicemail to request call back. Provider sent in refill. No additional refills will be given without an appointment as per provider.

## 2020-12-22 DIAGNOSIS — L821 Other seborrheic keratosis: Secondary | ICD-10-CM | POA: Diagnosis not present

## 2020-12-22 DIAGNOSIS — Z85828 Personal history of other malignant neoplasm of skin: Secondary | ICD-10-CM | POA: Diagnosis not present

## 2020-12-22 DIAGNOSIS — L905 Scar conditions and fibrosis of skin: Secondary | ICD-10-CM | POA: Diagnosis not present

## 2020-12-29 ENCOUNTER — Encounter: Payer: Self-pay | Admitting: Nurse Practitioner

## 2020-12-29 ENCOUNTER — Ambulatory Visit (INDEPENDENT_AMBULATORY_CARE_PROVIDER_SITE_OTHER): Payer: PPO | Admitting: Nurse Practitioner

## 2020-12-29 ENCOUNTER — Other Ambulatory Visit: Payer: Self-pay

## 2020-12-29 VITALS — BP 120/78 | HR 85 | Temp 98.6°F | Ht 60.0 in | Wt 132.2 lb

## 2020-12-29 DIAGNOSIS — Z79899 Other long term (current) drug therapy: Secondary | ICD-10-CM

## 2020-12-29 DIAGNOSIS — F5101 Primary insomnia: Secondary | ICD-10-CM

## 2020-12-29 DIAGNOSIS — J310 Chronic rhinitis: Secondary | ICD-10-CM | POA: Diagnosis not present

## 2020-12-29 DIAGNOSIS — F411 Generalized anxiety disorder: Secondary | ICD-10-CM | POA: Diagnosis not present

## 2020-12-29 MED ORDER — ALPRAZOLAM 0.5 MG PO TABS: 0.5000 mg | ORAL_TABLET | Freq: Every evening | ORAL | 2 refills | Status: DC | PRN

## 2020-12-29 NOTE — Progress Notes (Signed)
Subjective:    Patient ID: Rhonda Tapia, female    DOB: Jul 22, 1925, 87 y.o.   MRN: [id number]  HPI: Rhonda Tapia is a 87 y.o. female presenting with her friend, Remo Lipps, for medication refill.  Chief Complaint  Patient presents with   Establish Care    Runny nose for yrs nonstop, using zyrtec and other otc's nothing work   Medication Refill    xanax   INSOMNIA Patient reports she has been taking alprazolam 0.25 mg nightly for sleep for at least the past 2 years.  She has struggled her with sleep all of her life.  Her friend who is with her today reports she is a "worry wart."  Patient reports this medication is not working as well as it used to in the past. Duration: chronic Satisfied with sleep quality: no Difficulty falling asleep: yes Difficulty staying asleep: yes Waking a few hours after sleep onset: yes Early morning awakenings: yes Daytime hypersomnolence: no Wakes feeling refreshed: no Good sleep hygiene: no Apnea: no Snoring:  unknown Depressed/anxious mood: yes Recent stress: no Restless legs/nocturnal leg cramps: no Chronic pain/arthritis: no History of sleep study: no Treatments attempted: Trazodone, alprazolam  Depression screen [facility] 2/9 12/30/2020 03/30/2019 01/26/2018 07/27/2014  Decreased Interest 0 0 0 0  Down, Depressed, Hopeless 1 0 0 0  PHQ - 2 Score 1 0 0 0  Altered sleeping 2 - - -  Tired, decreased energy 2 - - -  Change in appetite 0 - - -  Feeling bad or failure about yourself  0 - - -  Trouble concentrating 2 - - -  Moving slowly or fidgety/restless 0 - - -  Suicidal thoughts 0 - - -  PHQ-9 Score 7 - - -  Difficult doing work/chores Somewhat difficult - - -   GAD 7 : Generalized Anxiety Score 12/30/2020  Nervous, Anxious, on Edge 3  Control/stop worrying 3  Worry too much - different things 3  Trouble relaxing 2  Restless 0  Easily annoyed or irritable 0  Afraid - awful might happen 2  Total GAD 7 Score 13  Anxiety Difficulty  Somewhat difficult   ALLERGIES Patient reports her nose runs constantly.  She is taking Claritin and Flonase which have not helped.  She reports her nose constantly drips clear drainage and sometimes she does not even feel it. Duration: chronic Runny nose: yes "clear Nasal congestion: no Nasal itching: no Sneezing: no Eye swelling, itching or discharge: no Post nasal drip: no Cough: no Sinus pressure: no  Ear pain: no  Ear pressure: no Fever: no Symptoms occur seasonally: no Symptoms occur perenially: yes Satisfied with current treatment: no Allergist evaluation in past: no Allergen injection immunotherapy: no Recurrent sinus infections: no ENT evaluation in past: no Known environmental allergy: no Indoor pets: no History of asthma: no Current allergy medications: Claritin, flonase  Allergies  Allergen Reactions   Penicillins Hives    Outpatient Encounter Medications as of 12/29/2020  Medication Sig   acetaminophen (TYLENOL) 500 MG tablet Take 500 mg by mouth every 6 (six) hours as needed.   fluticasone (FLONASE) 50 MCG/ACT nasal spray PLACE 1 SPRAY INTO BOTH NOSTRILS DAILY AS NEEDED FOR ALLERGIES OR RHINITIS.   loratadine (CLARITIN) 10 MG tablet Take 10 mg by mouth daily.   [DISCONTINUED] ALPRAZolam (XANAX) 0.25 MG tablet Take 1 tablet (0.25 mg total) by mouth at bedtime as needed for anxiety. Needs appt with new PCP for further refills   [START  ON 01/09/2021] ALPRAZolam (XANAX) 0.5 MG tablet Take 1 tablet (0.5 mg total) by mouth at bedtime as needed for anxiety.   No facility-administered encounter medications on file as of 12/29/2020.    Patient Active Problem List   Diagnosis Date Noted   GAD (generalized anxiety disorder) 03/31/2019   Hyperlipidemia 03/30/2019   Rhinitis 09/02/2018   Insomnia 06/12/2016   Routine general medical examination at a health care facility 07/26/2013    Past Medical History:  Diagnosis Date   No pertinent past medical history    pt  denies any problems   Pneumonia 06/26/11   currently    Relevant past medical, surgical, family and social history reviewed and updated as indicated. Interim medical history since our last visit reviewed.  Review of Systems Per HPI unless specifically indicated above     Objective:    BP 120/78   Pulse 85   Temp 98.6 F (37 C)   Ht 5' (1.524 m)   Wt 132 lb 3.2 oz (60 kg)   BMI 25.82 kg/m   Wt Readings from Last 3 Encounters:  12/29/20 132 lb 3.2 oz (60 kg)  04/26/20 133 lb (60.3 kg)  03/30/19 124 lb (56.2 kg)    Physical Exam Vitals and nursing note reviewed.  Constitutional:      General: She is not in acute distress.    Appearance: Normal appearance. She is not toxic-appearing.  HENT:     Right Ear: Decreased hearing noted.     Left Ear: Decreased hearing noted.  Eyes:     General: No scleral icterus.    Extraocular Movements: Extraocular movements intact.  Cardiovascular:     Rate and Rhythm: Normal rate and regular rhythm.  Pulmonary:     Effort: Pulmonary effort is normal. No respiratory distress.     Breath sounds: Normal breath sounds. No wheezing, rhonchi or rales.  Skin:    General: Skin is warm and dry.     Capillary Refill: Capillary refill takes less than 2 seconds.     Coloration: Skin is not jaundiced or pale.     Findings: No erythema.  Neurological:     Mental Status: She is alert and oriented to person, place, and time.     Motor: No weakness.     Gait: Gait normal.  Psychiatric:        Mood and Affect: Mood normal.        Behavior: Behavior normal.        Thought Content: Thought content normal.        Judgment: Judgment normal.       Assessment & Plan:   Problem List Items Addressed This Visit       Respiratory   Rhinitis    Chronic.  Continue nasal steroid and okay to try Xyzal to see if this helps with symptoms.         Other   Insomnia - Primary    Chronic insomnia with anxiety.  Pt is aware of risks of psychoactive  medication use to include increased sedation, respiratory suppression, falls, extrapyramidal movements,  dependence and cardiovascular events.  Pt would like to continue treatment as benefit determined to outweigh risk.  PDMP reviewed and appropriate-refill of Xanax given to start when current prescription runs out.  Controlled substance agreement signed today.  We will plan to increase to alprazolam 0.5 mg nightly.  Patient to monitor closely for side effects.  Educated patient extensively on risks of increasing the medication given  her age.  A lot of her insomnia seems to come from worrying, however she adamantly declines using SSRIs to help with day-to-day anxiety at this time desires to increase medication since she is sleeping about 3 hours nightly at this point.  Follow-up 3 months.        Relevant Medications   ALPRAZolam (XANAX) 0.5 MG tablet (Start on 01/09/2021)   GAD (generalized anxiety disorder)    Chronic.  GAD-7 and PHQ-9 elevated today, no SI or HI.  Patient does not desire daily medication to help with anxiety at this time and just wishes to focus on sleep.  She previously tried trazodone and had side effects and is nervous about trying anything else that may cause her side effects at her age.  She declines SSRIs at this time.  We will be available to patient if she changes her mind.       Relevant Medications   ALPRAZolam (XANAX) 0.5 MG tablet (Start on 01/09/2021)   Other Visit Diagnoses     Controlled substance agreement signed            Follow up plan: Return in about 3 months (around 03/31/2021) for insomnia, check labs.

## 2020-12-30 DIAGNOSIS — Z79899 Other long term (current) drug therapy: Secondary | ICD-10-CM | POA: Insufficient documentation

## 2020-12-30 NOTE — Assessment & Plan Note (Signed)
Chronic insomnia with anxiety.  Pt is aware of risks of psychoactive medication use to include increased sedation, respiratory suppression, falls, extrapyramidal movements,  dependence and cardiovascular events.  Pt would like to continue treatment as benefit determined to outweigh risk.  PDMP reviewed and appropriate-refill of Xanax given to start when current prescription runs out.  Controlled substance agreement signed today.  We will plan to increase to alprazolam 0.5 mg nightly.  Patient to monitor closely for side effects.  Educated patient extensively on risks of increasing the medication given her age.  A lot of her insomnia seems to come from worrying, however she adamantly declines using SSRIs to help with day-to-day anxiety at this time desires to increase medication since she is sleeping about 3 hours nightly at this point.  Follow-up 3 months.

## 2020-12-30 NOTE — Assessment & Plan Note (Signed)
Chronic.  GAD-7 and PHQ-9 elevated today, no SI or HI.  Patient does not desire daily medication to help with anxiety at this time and just wishes to focus on sleep.  She previously tried trazodone and had side effects and is nervous about trying anything else that may cause her side effects at her age.  She declines SSRIs at this time.  We will be available to patient if she changes her mind.

## 2020-12-30 NOTE — Assessment & Plan Note (Signed)
Chronic.  Continue nasal steroid and okay to try Xyzal to see if this helps with symptoms.

## 2021-03-30 ENCOUNTER — Encounter: Payer: Self-pay | Admitting: Nurse Practitioner

## 2021-03-30 ENCOUNTER — Ambulatory Visit (INDEPENDENT_AMBULATORY_CARE_PROVIDER_SITE_OTHER): Payer: PPO | Admitting: Nurse Practitioner

## 2021-03-30 ENCOUNTER — Other Ambulatory Visit: Payer: Self-pay

## 2021-03-30 VITALS — BP 128/80 | HR 84 | Ht 60.0 in | Wt 130.8 lb

## 2021-03-30 DIAGNOSIS — J310 Chronic rhinitis: Secondary | ICD-10-CM

## 2021-03-30 DIAGNOSIS — F411 Generalized anxiety disorder: Secondary | ICD-10-CM | POA: Diagnosis not present

## 2021-03-30 DIAGNOSIS — R062 Wheezing: Secondary | ICD-10-CM

## 2021-03-30 DIAGNOSIS — Z23 Encounter for immunization: Secondary | ICD-10-CM

## 2021-03-30 DIAGNOSIS — Z79899 Other long term (current) drug therapy: Secondary | ICD-10-CM

## 2021-03-30 DIAGNOSIS — F5101 Primary insomnia: Secondary | ICD-10-CM

## 2021-03-30 MED ORDER — ALBUTEROL SULFATE (2.5 MG/3ML) 0.083% IN NEBU
2.5000 mg | INHALATION_SOLUTION | Freq: Once | RESPIRATORY_TRACT | Status: AC
  Administered 2021-03-30: 2.5 mg via RESPIRATORY_TRACT

## 2021-03-30 MED ORDER — ALBUTEROL SULFATE HFA 108 (90 BASE) MCG/ACT IN AERS: 2.0000 | INHALATION_SPRAY | Freq: Four times a day (QID) | RESPIRATORY_TRACT | 0 refills | Status: DC | PRN

## 2021-03-30 MED ORDER — ALPRAZOLAM 0.5 MG PO TABS: 0.5000 mg | ORAL_TABLET | Freq: Every evening | ORAL | 2 refills | Status: DC | PRN

## 2021-03-30 NOTE — Progress Notes (Signed)
Subjective:    Patient ID: Rhonda Tapia, female    DOB: [birth year], 87 y.o.   MRN: [id number]  HPI: Rhonda Tapia is a 87 y.o. female presenting for 3 month follow up.  Chief Complaint  Patient presents with   Follow-up    3 month follow up ,  have some shortness of breathiness when walking     Reports she has runny nose all of the time.  Xyzal made her sleepy.  Has tried all of the OTC antihistamines and nasal sprays without benefit.  INSOMNIA Alprazolam is working well. Takes right before bed, does not make her dizzy or make her fall.  She wishes to continue on this medication. 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  She thinks the shortness of breath is worsening.   Duration: years Onset: sudden Description of breathing discomfort: short of breath Severity: moderate Episode duration:minutes Frequency: multiple times daily Related to exertion: yes Cough: no Chest tightness: no Wheezing: yes Fevers: no Chest pain: no Palpitations: no  Nausea: no Diaphoresis: no Deconditioning: yes Status: slightly worse  Allergies  Allergen Reactions   Penicillins Hives    Outpatient Encounter Medications as of 03/30/2021  Medication Sig   acetaminophen (TYLENOL) 500 MG tablet Take 500 mg by mouth every 6 (six) hours as needed.   albuterol (VENTOLIN HFA) 108 (90 Base) MCG/ACT inhaler Inhale 2 puffs into the lungs every 6 (six) hours as needed for wheezing or shortness of breath.    fluticasone (FLONASE) 50 MCG/ACT nasal spray PLACE 1 SPRAY INTO BOTH NOSTRILS DAILY AS NEEDED FOR ALLERGIES OR RHINITIS.   loratadine (CLARITIN) 10 MG tablet Take 10 mg by mouth daily.   [DISCONTINUED] ALPRAZolam (XANAX) 0.5 MG tablet Take 1 tablet (0.5 mg total) by mouth at bedtime as needed for anxiety.   ALPRAZolam (XANAX) 0.5 MG tablet Take 1 tablet (0.5 mg total) by mouth at bedtime as needed for anxiety.   [DISCONTINUED] ALPRAZolam (XANAX) 0.25 MG tablet Take 1 tablet (0.25 mg total) by mouth at bedtime as needed for anxiety. Needs appt with new PCP for further refills   [EXPIRED] albuterol (PROVENTIL) (2.5 MG/3ML) 0.083% nebulizer solution 2.5 mg    No facility-administered encounter medications on file as of 03/30/2021.    Patient Active Problem List   Diagnosis Date Noted   Wheezing 04/05/2021   Controlled substance agreement signed 12/30/2020   GAD (generalized anxiety disorder) 03/31/2019   Hyperlipidemia 03/30/2019   Rhinitis 09/02/2018   Insomnia 06/12/2016    Past Medical History:  Diagnosis Date   No pertinent past medical history    pt denies any problems   Pneumonia 06/26/11   currently   Routine general medical examination at a health care facility 07/26/2013    Relevant past medical, surgical, family and social history reviewed and updated as indicated. Interim medical history since our last visit reviewed.  Review of Systems Per HPI unless specifically indicated above     Objective:    BP 128/80 (BP Location: Left Arm, Patient Position: Sitting)   Pulse 84  Ht 5' (1.524 m)   Wt 130 lb 12.8 oz (59.3 kg)   SpO2 96%   BMI 25.55 kg/m   Wt Readings from Last 3 Encounters:  03/30/21 130 lb 12.8 oz (59.3 kg)  12/29/20 132 lb 3.2 oz (60 kg)  04/26/20 133 lb (60.3 kg)    Physical Exam Vitals and nursing note reviewed.  Constitutional:      General: She is not in acute distress.    Appearance: Normal appearance. She is not toxic-appearing.  Eyes:      General: No scleral icterus.    Extraocular Movements: Extraocular movements intact.  Cardiovascular:     Rate and Rhythm: Normal rate and regular rhythm.     Heart sounds: Normal heart sounds. No murmur heard. Pulmonary:     Effort: Pulmonary effort is normal. No respiratory distress.     Breath sounds: Wheezing present. No rhonchi or rales.  Musculoskeletal:        General: No swelling or tenderness. Normal range of motion.     Right lower leg: No edema.     Left lower leg: No edema.  Skin:    General: Skin is warm and dry.     Capillary Refill: Capillary refill takes less than 2 seconds.     Coloration: Skin is not jaundiced.     Findings: No bruising.  Neurological:     General: No focal deficit present.     Mental Status: She is alert and oriented to person, place, and time.     Motor: No weakness.     Gait: Gait normal.  Psychiatric:        Mood and Affect: Mood normal.        Behavior: Behavior normal.        Thought Content: Thought content normal.        Judgment: Judgment normal.       Assessment & Plan:   Problem List Items Addressed This Visit       Respiratory   Rhinitis    Chronic.  She has tried all OTC medications without much benefit.  She can continue nasal steroid and try 1/2 tablet Xyzal since she reports it made her sleepy.  She declines referral to allergist today - thinks it is genetic because both her father and brother had the same thing.        Other   Wheezing    Acute.  Albuterol breathing treatment given in clinic today with good improvement in wheezing and shortness of breath.  Therefore, I am thinking patient may have some underlying obstructive lung disease.  I gave her a rescue inhaler to use as needed for shortness of breath or wheezing and told her to follow up with [place] if she is using this more than 1-2 times weekly.  We may want to check pulmonary function testing and/or start on a daily inhaler regimen.        Relevant Medications    albuterol (VENTOLIN HFA) 108 (90 Base) MCG/ACT inhaler   Insomnia - Primary    Chronic with anxiety.  Pt is aware of risks of psychoactive medication use to include increased sedation, respiratory suppression, falls, extrapyramidal movements,  dependence and cardiovascular events.  Pt would like to continue treatment as benefit determined to outweigh risk.  Controlled substance agreement reviewed and signed today.  PDMP reviewed - refill given for alprazolam 0.5 mg qhs prn insomnia #30 with 2 refills.  Patient to continue to monitor closely for side effects and  is aware we will not be increasing this prescription.  She also has underlying anxiety and declines daily treatment for this at the present time.  Follow up in 3 months.         Relevant Medications   ALPRAZolam (XANAX) 0.5 MG tablet   GAD (generalized anxiety disorder)    Declines daily treatment for this currently.  Will be available to patient if she changes her mind.  As per the insomnia plan, we discussed risk of using benzodiazepine therapy extensively and patient wishes to continue.      Relevant Medications   ALPRAZolam (XANAX) 0.5 MG tablet   Controlled substance agreement signed    Alprazolam 0.5 mg #30 with 2 refills      Other Visit Diagnoses     Influenza vaccine needed       Relevant Orders   Flu vaccine HIGH DOSE PF (Fluzone High dose) (Completed)        Follow up plan: Return in about 3 months (around 06/29/2021) for insomnia, breathing f/u.

## 2021-04-05 ENCOUNTER — Encounter: Payer: Self-pay | Admitting: Nurse Practitioner

## 2021-04-05 DIAGNOSIS — R062 Wheezing: Secondary | ICD-10-CM | POA: Insufficient documentation

## 2021-04-05 NOTE — Assessment & Plan Note (Signed)
Declines daily treatment for this currently.  Will be available to patient if she changes her mind.  As per the insomnia plan, we discussed risk of using benzodiazepine therapy extensively and patient wishes to continue.

## 2021-04-05 NOTE — Assessment & Plan Note (Signed)
Acute.  Albuterol breathing treatment given in clinic today with good improvement in wheezing and shortness of breath.  Therefore, I am thinking patient may have some underlying obstructive lung disease.  I gave her a rescue inhaler to use as needed for shortness of breath or wheezing and told her to follow up with [place] if she is using this more than 1-2 times weekly.  We may want to check pulmonary function testing and/or start on a daily inhaler regimen.

## 2021-04-05 NOTE — Assessment & Plan Note (Signed)
Alprazolam 0.5 mg #30 with 2 refills

## 2021-04-05 NOTE — Assessment & Plan Note (Signed)
Chronic.  She has tried all OTC medications without much benefit.  She can continue nasal steroid and try 1/2 tablet Xyzal since she reports it made her sleepy.  She declines referral to allergist today - thinks it is genetic because both her father and brother had the same thing.

## 2021-04-05 NOTE — Assessment & Plan Note (Signed)
Chronic with anxiety.  Pt is aware of risks of psychoactive medication use to include increased sedation, respiratory suppression, falls, extrapyramidal movements,  dependence and cardiovascular events.  Pt would like to continue treatment as benefit determined to outweigh risk.  Controlled substance agreement reviewed and signed today.  PDMP reviewed - refill given for alprazolam 0.5 mg qhs prn insomnia #30 with 2 refills.  Patient to continue to monitor closely for side effects and is aware we will not be increasing this prescription.  She also has underlying anxiety and declines daily treatment for this at the present time.  Follow up in 3 months.

## 2021-04-19 ENCOUNTER — Other Ambulatory Visit: Payer: Self-pay | Admitting: Nurse Practitioner

## 2021-04-19 DIAGNOSIS — R062 Wheezing: Secondary | ICD-10-CM

## 2021-04-27 ENCOUNTER — Other Ambulatory Visit: Payer: Self-pay

## 2021-04-27 ENCOUNTER — Encounter: Payer: Self-pay | Admitting: Nurse Practitioner

## 2021-04-27 ENCOUNTER — Ambulatory Visit (INDEPENDENT_AMBULATORY_CARE_PROVIDER_SITE_OTHER): Payer: PPO | Admitting: Nurse Practitioner

## 2021-04-27 VITALS — BP 136/82 | HR 74 | Temp 97.7°F | Resp 12 | Ht 61.0 in | Wt 130.0 lb

## 2021-04-27 DIAGNOSIS — Z0000 Encounter for general adult medical examination without abnormal findings: Secondary | ICD-10-CM

## 2021-04-27 DIAGNOSIS — F5101 Primary insomnia: Secondary | ICD-10-CM

## 2021-04-27 DIAGNOSIS — J4520 Mild intermittent asthma, uncomplicated: Secondary | ICD-10-CM | POA: Diagnosis not present

## 2021-04-27 DIAGNOSIS — F411 Generalized anxiety disorder: Secondary | ICD-10-CM

## 2021-04-27 MED ORDER — OMEPRAZOLE 20 MG PO CPDR: 20.0000 mg | DELAYED_RELEASE_CAPSULE | Freq: Every day | ORAL | 1 refills | Status: DC

## 2021-04-27 MED ORDER — ALBUTEROL SULFATE (2.5 MG/3ML) 0.083% IN NEBU
2.5000 mg | INHALATION_SOLUTION | Freq: Once | RESPIRATORY_TRACT | Status: AC
  Administered 2021-04-27: 2.5 mg via RESPIRATORY_TRACT

## 2021-04-27 NOTE — Progress Notes (Signed)
Patient: Rhonda Tapia, Female    DOB: 1926/02/26, 87 y.o.   MRN: [id number]  Visit Date: 04/27/2021  Today's Provider: Eulogio Bear, NP   Chief Complaint  Patient presents with   Medicare Wellness    And 4 week f/u    Subjective:   Rhonda Tapia is a 87 y.o. female who presents today for her Subsequent Annual Wellness Visit.  Care Team:  Primary care - Noemi Chapel, NP Dermatology - [facility] [place]    HPI  Patient lives alone.  Has good friend - Remo Lipps Apple that checks in on her frequently.    She carries a portable phone around room to room with her while she is at home.  She calls her friend before and after she gets in the bath tub.  She is not interested in a 24/7 emergency device.  Review of Systems  Constitutional: Negative.   HENT: Negative.    Eyes: Negative.   Respiratory:  Positive for wheezing. Negative for cough, chest tightness and shortness of breath.   Cardiovascular: Negative.   Gastrointestinal: Negative.   Genitourinary: Negative.   Musculoskeletal: Negative.   Skin: Negative.   Neurological: Negative.   Psychiatric/Behavioral: Negative.     Past Medical History:  Diagnosis Date   No pertinent past medical history    pt denies any problems   Pneumonia 06/26/11   currently   Routine general medical examination at a health care facility 07/26/2013    Past Surgical History:  Procedure Laterality Date   APPENDECTOMY     hemoroidectomy      Family History  Problem Relation Age of Onset   Asthma Mother    Hypertension Mother    Stroke Mother    Alcohol abuse Father    Cancer Father    Alcohol abuse Brother    Cancer Brother        lung cancer    Social History   Socioeconomic History   Marital status: Widowed    Spouse name: Not on file   Number of children: Not on file   Years of education: Not on file   Highest education level: Not on file  Occupational History   Not on file  Tobacco Use   Smoking status: Never    Smokeless tobacco: Never  Substance and Sexual Activity   Alcohol use: No   Drug use: No   Sexual activity: Never  Other Topics Concern   Not on file  Social History Narrative   Not on file   Social Determinants of Health   Financial Resource Strain: Not on file  Food Insecurity: Not on file  Transportation Needs: Not on file  Physical Activity: Not on file  Stress: Not on file  Social Connections: Not on file  Intimate Partner Violence: Not on file    Outpatient Encounter Medications as of 04/27/2021  Medication Sig   omeprazole (PRILOSEC) 20 MG capsule Take 1 capsule (20 mg total) by mouth daily.   acetaminophen (TYLENOL) 500 MG tablet Take 500 mg by mouth every 6 (six) hours as needed.   albuterol (VENTOLIN HFA) 108 (90 Base) MCG/ACT inhaler TAKE 2 PUFFS BY MOUTH EVERY 6 HOURS AS NEEDED FOR WHEEZE OR SHORTNESS OF BREATH   ALPRAZolam (XANAX) 0.5 MG tablet Take 1 tablet (0.5 mg total) by mouth at bedtime as needed for anxiety.   fluticasone (FLONASE) 50 MCG/ACT nasal spray PLACE 1 SPRAY INTO BOTH NOSTRILS DAILY AS NEEDED FOR ALLERGIES OR RHINITIS.   loratadine (CLARITIN)  10 MG tablet Take 10 mg by mouth daily.   [EXPIRED] albuterol (PROVENTIL) (2.5 MG/3ML) 0.083% nebulizer solution 2.5 mg    No facility-administered encounter medications on file as of 04/27/2021.    Functional Status Survey: Is the patient deaf or have difficulty hearing?: Yes Does the patient have difficulty seeing, even when wearing glasses/contacts?: No Does the patient have difficulty concentrating, remembering, or making decisions?: No Does the patient have difficulty walking or climbing stairs?: Yes Does the patient have difficulty dressing or bathing?: No Does the patient have difficulty doing errands alone such as visiting a doctor's office or shopping?: No   Fall Risk Assessment Fall Risk  04/27/2021 03/30/2021 12/29/2020 04/26/2020 03/30/2019  Falls in the past year? 0 0 0 0 0  Number falls in  past yr: 0 0 0 0 -  Injury with Fall? 0 0 0 0 -  Follow up Falls evaluation completed Falls evaluation completed - - Falls evaluation completed    Depression Screen Depression screen [facility] 2/9 04/27/2021 12/30/2020 03/30/2019 01/26/2018 07/27/2014  Decreased Interest 0 0 0 0 0  Down, Depressed, Hopeless 0 1 0 0 0  PHQ - 2 Score 0 1 0 0 0  Altered sleeping - 2 - - -  Tired, decreased energy - 2 - - -  Change in appetite - 0 - - -  Feeling bad or failure about yourself  - 0 - - -  Trouble concentrating - 2 - - -  Moving slowly or fidgety/restless - 0 - - -  Suicidal thoughts - 0 - - -  PHQ-9 Score - 7 - - -  Difficult doing work/chores - Somewhat difficult - - -    6CIT Screen 04/27/2021  What Year? 0 points  What month? 0 points  What time? 0 points  Count back from 20 0 points  Months in reverse 0 points  Repeat phrase 4 points  Total Score 4   Advanced Directives Does patient have a HCPOA?    No; paperwork given today  If yes, name and contact information: son, Harrie Jeans  Does patient have a living will or MOST form?  yes  Objective:   Vitals: BP 136/82 (BP Location: Right Arm, Patient Position: Sitting, Cuff Size: Normal)   Pulse 74   Temp 97.7 F (36.5 C)   Resp 12   Ht 5\' 1"  (1.549 m)   Wt 130 lb (59 kg)   SpO2 96%   BMI 24.56 kg/m  Body mass index is 24.56 kg/m. No results found.  Physical Exam Vitals and nursing note reviewed.  Constitutional:      General: She is not in acute distress.    Appearance: Normal appearance. She is not toxic-appearing.  Cardiovascular:     Rate and Rhythm: Normal rate and regular rhythm.  Pulmonary:     Effort: Pulmonary effort is normal. No respiratory distress.     Breath sounds: Normal breath sounds. No wheezing, rhonchi or rales.     Comments: Upper airway sounds heard audibly  Skin:    General: Skin is warm and dry.     Coloration: Skin is not jaundiced or pale.     Findings: No erythema.  Neurological:     Mental  Status: She is alert and oriented to person, place, and time.     Gait: Gait normal.  Psychiatric:        Mood and Affect: Mood normal.        Behavior: Behavior normal.  Thought Content: Thought content normal.        Judgment: Judgment normal.    Assessment & Plan:    Annual Wellness Visit  1. Encounter for annual wellness exam in Medicare patient  - CBC with Differential/Platelet - COMPLETE METABOLIC PANEL WITH GFR  2. Primary insomnia Stable with alprazolam 0.5 mg nightly.  Refill given at last visit.  UDS needed, however patient cannot provide today.  Will provide at next visit.   - CBC with Differential/Platelet - COMPLETE METABOLIC PANEL WITH GFR  3. GAD (generalized anxiety disorder) Stable with alprazolam 0.5 mg nightly.  Refill given at last visit.  UDS needed, however patient cannot provide today.  Will provide at next visit.   - CBC with Differential/Platelet - COMPLETE METABOLIC PANEL WITH GFR  4. Mild intermittent reactive airway disease without complication Chronic.  Has been using rescue inhaler a couple of times per week, not on a regular basis.  We discussed daily maintenance inhaler today, however no wheezes heard in lungs to auscultation.  Question upper airway reactivity secondary to acid reflux.  Start omeprazole 20 mg daily and monitor for benefit.  Follow up in 2 months.   - omeprazole (PRILOSEC) 20 MG capsule; Take 1 capsule (20 mg total) by mouth daily.  Dispense: 90 capsule; Refill: 1 - albuterol (PROVENTIL) (2.5 MG/3ML) 0.083% nebulizer solution 2.5 mg   Reviewed patient's Family Medical History  Reviewed and updated list of patient's medical providers  Assessment of cognitive impairment was done  Assessed patient's functional ability  Established a written schedule for health screening [place] Completed and Reviewed  Health Maintenance reviewed -   Recommend COVID booster, Tdap, Shingles, however patient  declines.  Also recommended DEXA scan but she declines this.   Immunization History  Administered Date(s) Administered   Fluad Quad(high Dose 65+) 03/30/2019, 04/26/2020   Influenza Whole 04/14/2013   Influenza, High Dose Seasonal PF 05/05/2014, 04/13/2015, 05/02/2016, 04/17/2017, 04/09/2018, 04/10/2018, 03/30/2021   PFIZER(Purple Top)SARS-COV-2 Vaccination 08/20/2019, 09/15/2019   Pneumococcal Conjugate-13 07/26/2013    Health Maintenance  Topic Date Due   TETANUS/TDAP  Never done   Zoster Vaccines- Shingrix (1 of 2) Never done   DEXA SCAN  Never done   COVID-19 Vaccine (3 - Pfizer risk series) 10/13/2019   INFLUENZA VACCINE  Completed   HPV VACCINES  Aged Out    Discussed health benefits of physical activity, and encouraged her to engage in regular exercise appropriate for her age and condition.   Meds ordered this encounter  Medications   omeprazole (PRILOSEC) 20 MG capsule    Sig: Take 1 capsule (20 mg total) by mouth daily.    Dispense:  90 capsule    Refill:  1    Order Specific Question:   Supervising Provider    Answer:   Jenna Luo T [[id number]]   albuterol (PROVENTIL) (2.5 MG/3ML) 0.083% nebulizer solution 2.5 mg     Current Outpatient Medications:    omeprazole (PRILOSEC) 20 MG capsule, Take 1 capsule (20 mg total) by mouth daily., Disp: 90 capsule, Rfl: 1   acetaminophen (TYLENOL) 500 MG tablet, Take 500 mg by mouth every 6 (six) hours as needed., Disp: , Rfl:    albuterol (VENTOLIN HFA) 108 (90 Base) MCG/ACT inhaler, TAKE 2 PUFFS BY MOUTH EVERY 6 HOURS AS NEEDED FOR WHEEZE OR SHORTNESS OF BREATH, Disp: 8.5 each, Rfl: 1   ALPRAZolam (XANAX) 0.5 MG tablet, Take 1 tablet (0.5 mg total) by mouth at bedtime as  needed for anxiety., Disp: 30 tablet, Rfl: 2   fluticasone (FLONASE) 50 MCG/ACT nasal spray, PLACE 1 SPRAY INTO BOTH NOSTRILS DAILY AS NEEDED FOR ALLERGIES OR RHINITIS., Disp: 48 mL, Rfl: 1   loratadine (CLARITIN) 10 MG tablet, Take 10 mg by mouth daily., Disp:  , Rfl:  There are no discontinued medications.  Next Medicare Wellness Visit in 12+ months

## 2021-04-28 LAB — CBC WITH DIFFERENTIAL/PLATELET
Absolute Monocytes: 794 cells/uL (ref 200–950)
Basophils Absolute: 41 cells/uL (ref 0–200)
Basophils Relative: 0.6 %
Eosinophils Absolute: 69 cells/uL (ref 15–500)
Eosinophils Relative: 1 %
HCT: 38.4 % (ref 35.0–45.0)
Hemoglobin: 12.8 g/dL (ref 11.7–15.5)
Lymphs Abs: 1532 cells/uL (ref 850–3900)
MCH: 31.1 pg (ref 27.0–33.0)
MCHC: 33.3 g/dL (ref 32.0–36.0)
MCV: 93.2 fL (ref 80.0–100.0)
MPV: 10.2 fL (ref 7.5–12.5)
Monocytes Relative: 11.5 %
Neutro Abs: 4464 cells/uL (ref 1500–7800)
Neutrophils Relative %: 64.7 %
Platelets: 269 10*3/uL (ref 140–400)
RBC: 4.12 10*6/uL (ref 3.80–5.10)
RDW: 12.3 % (ref 11.0–15.0)
Total Lymphocyte: 22.2 %
WBC: 6.9 10*3/uL (ref 3.8–10.8)

## 2021-04-28 LAB — COMPLETE METABOLIC PANEL WITH GFR
AG Ratio: 1.5 (calc) (ref 1.0–2.5)
ALT: 12 U/L (ref 6–29)
AST: 16 U/L (ref 10–35)
Albumin: 4.1 g/dL (ref 3.6–5.1)
Alkaline phosphatase (APISO): 35 U/L — ABNORMAL LOW (ref 37–153)
BUN: 10 mg/dL (ref 7–25)
CO2: 31 mmol/L (ref 20–32)
Calcium: 9.2 mg/dL (ref 8.6–10.4)
Chloride: 99 mmol/L (ref 98–110)
Creat: 0.62 mg/dL (ref 0.60–0.95)
Globulin: 2.7 g/dL (calc) (ref 1.9–3.7)
Glucose, Bld: 88 mg/dL (ref 65–99)
Potassium: 4.5 mmol/L (ref 3.5–5.3)
Sodium: 137 mmol/L (ref 135–146)
Total Bilirubin: 0.4 mg/dL (ref 0.2–1.2)
Total Protein: 6.8 g/dL (ref 6.1–8.1)
eGFR: 83 mL/min/{1.73_m2} (ref >=60)

## 2021-06-29 ENCOUNTER — Encounter: Payer: Self-pay | Admitting: Nurse Practitioner

## 2021-06-29 ENCOUNTER — Ambulatory Visit (INDEPENDENT_AMBULATORY_CARE_PROVIDER_SITE_OTHER): Payer: PPO | Admitting: Nurse Practitioner

## 2021-06-29 ENCOUNTER — Other Ambulatory Visit: Payer: Self-pay

## 2021-06-29 VITALS — BP 122/82 | HR 87 | Temp 98.2°F | Resp 14 | Ht 61.0 in | Wt 130.0 lb

## 2021-06-29 DIAGNOSIS — J45909 Unspecified asthma, uncomplicated: Secondary | ICD-10-CM | POA: Insufficient documentation

## 2021-06-29 DIAGNOSIS — J4520 Mild intermittent asthma, uncomplicated: Secondary | ICD-10-CM

## 2021-06-29 DIAGNOSIS — F5101 Primary insomnia: Secondary | ICD-10-CM

## 2021-06-29 MED ORDER — ALPRAZOLAM 0.5 MG PO TABS: 0.5000 mg | ORAL_TABLET | Freq: Every evening | ORAL | 2 refills | Status: DC | PRN

## 2021-06-29 NOTE — Progress Notes (Signed)
Subjective:    Patient ID: Rhonda Tapia, female    DOB: [birth year], 87 y.o.   MRN: [id number]  HPI: Rhonda Tapia is a 87 y.o. female presenting for follow up.  Chief Complaint  Patient presents with   Office visit   COPD Using albuterol inhaler at least once daily.  She is not interested in a non-rescue medication at this time.  Her breathing does not affect her quality of life.  She denies shortness of breath, cough, or chest pain today. COPD status: stable Satisfied with current treatment?: yes Oxygen use: no Dyspnea frequency: rarely  Cough frequency: never Rescue inhaler frequency: about daily Limitation of activity: no Productive cough: no Last Spirometry: never Pneumovax: has had 1 dose and declines further vaccination Influenza: Up to Date  INSOMNIA Currently taking alprazolam 0.5 mg nightly to help with sleep.  She reports she is tolerating this medication well and does not have adverse effects from taking it.  She has not had any falls.  She sometimes gets dizzy if she changes positions too quickly but is general is very careful with position changes.  Duration: chronic Satisfied with sleep quality: yes Difficulty falling asleep: yes Difficulty staying asleep: yes Waking a few hours after sleep onset: yes Early morning awakenings: yes Daytime hypersomnolence: no Wakes feeling refreshed: yes  Allergies  Allergen Reactions   Penicillins Hives    Outpatient Encounter Medications as of 06/29/2021  Medication Sig   acetaminophen (TYLENOL) 500 MG tablet Take 500 mg by mouth every 6 (six) hours as needed.   albuterol (VENTOLIN HFA) 108 (90 Base) MCG/ACT inhaler TAKE 2 PUFFS BY MOUTH EVERY 6 HOURS AS NEEDED FOR WHEEZE OR SHORTNESS OF BREATH   fluticasone (FLONASE) 50 MCG/ACT nasal spray PLACE 1 SPRAY INTO BOTH NOSTRILS DAILY AS NEEDED FOR ALLERGIES OR RHINITIS.   loratadine (CLARITIN) 10 MG tablet Take 10 mg by mouth daily.   omeprazole (PRILOSEC) 20 MG  capsule Take 1 capsule (20 mg total) by mouth daily.   [DISCONTINUED] ALPRAZolam (XANAX) 0.5 MG tablet Take 1 tablet (0.5 mg total) by mouth at bedtime as needed for anxiety.   [START ON 07/03/2021] ALPRAZolam (XANAX) 0.5 MG tablet Take 1 tablet (0.5 mg total) by mouth at bedtime as needed for anxiety.   No facility-administered encounter medications on file as of 06/29/2021.    Patient Active Problem List   Diagnosis Date Noted   Reactive airway disease 06/29/2021   Wheezing 04/05/2021   Controlled substance agreement signed 12/30/2020   GAD (generalized anxiety disorder) 03/31/2019   Hyperlipidemia 03/30/2019   Rhinitis 09/02/2018   Insomnia 06/12/2016    Past Medical History:  Diagnosis Date   No pertinent past medical history    pt denies any problems   Pneumonia 06/26/11   currently   Routine general medical examination at a health care facility 07/26/2013    Relevant past medical, surgical, family and social history reviewed and updated as indicated. Interim medical history since our last visit reviewed.  Review of Systems Per HPI unless specifically indicated above     Objective:    BP 122/82 (BP Location: Left Arm, Patient Position: Sitting, Cuff Size: Normal)    Pulse 87    Temp 98.2 F (36.8 C) (Temporal)    Resp 14    Ht 5\' 1"  (1.549 m)    Wt 130 lb (59 kg)    SpO2 93%    BMI 24.56 kg/m   Wt Readings from Last 3 Encounters:  06/29/21 130 lb (59 kg)  04/27/21 130 lb (59 kg)  03/30/21 130 lb 12.8 oz (59.3 kg)    Physical Exam Vitals and nursing note reviewed.  Constitutional:      General: She is not in acute distress.    Appearance: Normal appearance. She is not toxic-appearing.  Cardiovascular:     Rate and Rhythm: Normal rate and regular rhythm.     Heart sounds: Normal heart sounds. No murmur heard. Pulmonary:     Effort: Pulmonary effort is normal. No respiratory distress.     Breath sounds: Normal breath sounds. No wheezing, rhonchi or rales.   Skin:    General: Skin is warm and dry.     Capillary Refill: Capillary refill takes less than 2 seconds.     Coloration: Skin is not jaundiced or pale.     Findings: No erythema.  Neurological:     Mental Status: She is alert and oriented to person, place, and time.     Motor: No weakness.     Gait: Gait normal.  Psychiatric:        Mood and Affect: Mood normal.        Behavior: Behavior normal.        Thought Content: Thought content normal.        Judgment: Judgment normal.      Assessment & Plan:   Problem List Items Addressed This Visit       Respiratory   Reactive airway disease - Primary    Chronic.  Stable with as needed albuterol inhaler.  Patient denies issues with breathing today and pulmonary examination is normal.  She declines further work up for breathing at this time as well as an alternate inhaler.  Given her age, I think this is reasonable.  I encouraged pneumonia vaccination, shingles, and COVID booster and she declines all vaccines today.        Other   Insomnia    Chronic.  Pt is aware of risks of psychoactive medication use to include increased sedation, respiratory suppression, falls, extrapyramidal movements,  dependence and cardiovascular events.  Pt would like to continue treatment as benefit determined to outweigh risk.   Controlled substance agreement has been signed in the past.  UDS at next visit.  PDMP reviewed and appropriate - will send in refill of alprazolam 0.5 mg qhs prn #30 with 2 refills.  Patient to continue to monitor for side effects closely.  Follow up in 3 months.       Relevant Medications   ALPRAZolam (XANAX) 0.5 MG tablet (Start on 07/03/2021)     Follow up plan: Return in about 3 months (around 09/27/2021) for insomnia follow up.

## 2021-06-29 NOTE — Assessment & Plan Note (Signed)
Chronic.  Stable with as needed albuterol inhaler.  Patient denies issues with breathing today and pulmonary examination is normal.  She declines further work up for breathing at this time as well as an alternate inhaler.  Given her age, I think this is reasonable.  I encouraged pneumonia vaccination, shingles, and COVID booster and she declines all vaccines today.

## 2021-06-29 NOTE — Assessment & Plan Note (Signed)
Chronic.  Pt is aware of risks of psychoactive medication use to include increased sedation, respiratory suppression, falls, extrapyramidal movements,  dependence and cardiovascular events.  Pt would like to continue treatment as benefit determined to outweigh risk.   Controlled substance agreement has been signed in the past.  UDS at next visit.  PDMP reviewed and appropriate - will send in refill of alprazolam 0.5 mg qhs prn #30 with 2 refills.  Patient to continue to monitor for side effects closely.  Follow up in 3 months.

## 2021-08-16 ENCOUNTER — Ambulatory Visit: Payer: PPO | Admitting: Nurse Practitioner

## 2021-09-27 ENCOUNTER — Ambulatory Visit: Payer: PPO | Admitting: Nurse Practitioner

## 2021-10-01 ENCOUNTER — Other Ambulatory Visit: Payer: Self-pay

## 2021-10-01 DIAGNOSIS — F5101 Primary insomnia: Secondary | ICD-10-CM

## 2021-10-01 NOTE — Telephone Encounter (Signed)
Remo Lipps stepping out; requesting call back on mobile [phone]. ?

## 2021-10-01 NOTE — Telephone Encounter (Signed)
Received call from Remo Lipps to request courtesy refill of ? ?ALPRAZolam (XANAX) 0.5 MG tablet [[id number]]  ? ?Pharmacy confirmed as ? ?[id number] [address] ?[id number] ?Phone:  [phone] Fax:  [phone] ?DEA #:  [id number]? ?Please advise JRemo Lippsat [phone]; requesting call back when Rx called in.  ?

## 2021-10-02 MED ORDER — ALPRAZOLAM 0.5 MG PO TABS: 0.5000 mg | ORAL_TABLET | Freq: Every evening | ORAL | 2 refills | Status: DC | PRN

## 2021-10-02 NOTE — Telephone Encounter (Signed)
LOV 06/29/21 ?Last refill 07/03/21, #30, 2 refills ? ?Please review, thanks! ? ?Caregiver is asking for refill to be completed ASAP and patient is experiencing agitation. Advised caregiver she does need to establish with new PCP as soon as possible.  ? ?

## 2021-10-22 ENCOUNTER — Other Ambulatory Visit: Payer: Self-pay | Admitting: Nurse Practitioner

## 2021-10-22 DIAGNOSIS — J4520 Mild intermittent asthma, uncomplicated: Secondary | ICD-10-CM

## 2021-11-16 ENCOUNTER — Ambulatory Visit (INDEPENDENT_AMBULATORY_CARE_PROVIDER_SITE_OTHER): Payer: PPO

## 2021-11-16 ENCOUNTER — Encounter (HOSPITAL_COMMUNITY): Payer: Self-pay | Admitting: Emergency Medicine

## 2021-11-16 ENCOUNTER — Ambulatory Visit (HOSPITAL_COMMUNITY)
Admission: EM | Admit: 2021-11-16 | Discharge: 2021-11-16 | Disposition: A | Discharge status: Home / Self Care | Payer: PPO | Attending: Physician Assistant | Admitting: Physician Assistant

## 2021-11-16 DIAGNOSIS — R441 Visual hallucinations: Secondary | ICD-10-CM | POA: Diagnosis not present

## 2021-11-16 DIAGNOSIS — R440 Auditory hallucinations: Secondary | ICD-10-CM | POA: Diagnosis not present

## 2021-11-16 DIAGNOSIS — R443 Hallucinations, unspecified: Secondary | ICD-10-CM | POA: Insufficient documentation

## 2021-11-16 DIAGNOSIS — J449 Chronic obstructive pulmonary disease, unspecified: Secondary | ICD-10-CM | POA: Diagnosis not present

## 2021-11-16 DIAGNOSIS — R350 Frequency of micturition: Secondary | ICD-10-CM | POA: Insufficient documentation

## 2021-11-16 LAB — COMPREHENSIVE METABOLIC PANEL
ALT: 13 U/L (ref 0–44)
AST: 17 U/L (ref 15–41)
Albumin: 3.9 g/dL (ref 3.5–5.0)
Alkaline Phosphatase: 35 U/L — ABNORMAL LOW (ref 38–126)
Anion gap: 7 (ref 5–15)
BUN: 9 mg/dL (ref 8–23)
CO2: 29 mmol/L (ref 22–32)
Calcium: 9.2 mg/dL (ref 8.9–10.3)
Chloride: 100 mmol/L (ref 98–111)
Creatinine, Ser: 0.64 mg/dL (ref 0.44–1.00)
GFR, Estimated: >60 mL/min (ref >60)
Glucose, Bld: 101 mg/dL — ABNORMAL HIGH (ref 70–99)
Potassium: 4.1 mmol/L (ref 3.5–5.1)
Sodium: 136 mmol/L (ref 135–145)
Total Bilirubin: 0.6 mg/dL (ref 0.3–1.2)
Total Protein: 7 g/dL (ref 6.5–8.1)

## 2021-11-16 LAB — POCT URINALYSIS DIPSTICK, ED / UC
Bilirubin Urine: NEGATIVE
Glucose, UA: NEGATIVE mg/dL
Ketones, ur: NEGATIVE mg/dL
Leukocytes,Ua: NEGATIVE
Nitrite: NEGATIVE
Protein, ur: NEGATIVE mg/dL
Specific Gravity, Urine: 1.015 (ref 1.005–1.030)
Urobilinogen, UA: 0.2 mg/dL (ref 0.0–1.0)
pH: 7 (ref 5.0–8.0)

## 2021-11-16 LAB — CBC WITH DIFFERENTIAL/PLATELET
Abs Immature Granulocytes: 0.03 10*3/uL (ref 0.00–0.07)
Basophils Absolute: 0 10*3/uL (ref 0.0–0.1)
Basophils Relative: 0 %
Eosinophils Absolute: 0 10*3/uL (ref 0.0–0.5)
Eosinophils Relative: 1 %
HCT: 36.8 % (ref 36.0–46.0)
Hemoglobin: 12.2 g/dL (ref 12.0–15.0)
Immature Granulocytes: 0 %
Lymphocytes Relative: 20 %
Lymphs Abs: 1.5 10*3/uL (ref 0.7–4.0)
MCH: 31.4 pg (ref 26.0–34.0)
MCHC: 33.2 g/dL (ref 30.0–36.0)
MCV: 94.8 fL (ref 80.0–100.0)
Monocytes Absolute: 0.7 10*3/uL (ref 0.1–1.0)
Monocytes Relative: 9 %
Neutro Abs: 5.2 10*3/uL (ref 1.7–7.7)
Neutrophils Relative %: 70 %
Platelets: 255 10*3/uL (ref 150–400)
RBC: 3.88 MIL/uL (ref 3.87–5.11)
RDW: 12.4 % (ref 11.5–15.5)
WBC: 7.4 10*3/uL (ref 4.0–10.5)
nRBC: 0 % (ref 0.0–0.2)

## 2021-11-16 LAB — AMMONIA: Ammonia: 15 umol/L (ref 9–35)

## 2021-11-16 NOTE — ED Provider Notes (Signed)
?[place] ? ? ? ?CSN: [id number] ?Arrival date & time: 11/16/21  1329 ? ? ?  ? ?History   ?Chief Complaint ?Chief Complaint  ?Patient presents with  ? Hallucinations  ? Urinary Frequency  ? ? ?HPI ?Rhonda Tapia is a 88 y.o. female.  ? ?Patient presents today accompanied by her family friend who help provide the 46 of history.  Reports a several day history of intermittent dysuria, urinary frequency.  More concerning, she has been reporting auditory and visual hallucinations including seeing people who were not there, unusual colors, hearing music that is not playing.  She denies any medication changes or recent head injury.  She denies formal diagnosis of dementia and has not had hallucinations in the past.  She is currently prescribed alprazolam and has been taking this as prescribed without any significant dose adjustment.  She denies any additional symptoms including fever, abdominal pain, nausea, vomiting.  She denies history of recurrent urinary tract infection, nephrolithiasis, self-catheterization, recent urogenital procedure.  She denies history of diabetes or immunosuppression. ? ? ?Past Medical History:  ?Diagnosis Date  ? No pertinent past medical history   ? pt denies any problems  ? Pneumonia 06/26/11  ? currently  ? Routine general medical examination at a health care facility 07/26/2013  ? ? ?Patient Active Problem List  ? Diagnosis Date Noted  ? Reactive airway disease 06/29/2021  ? Wheezing 04/05/2021  ? Controlled substance agreement signed 12/30/2020  ? GAD (generalized anxiety disorder) 03/31/2019  ? Hyperlipidemia 03/30/2019  ? Rhinitis 09/02/2018  ? Insomnia 06/12/2016  ? ? ?Past Surgical History:  ?Procedure Laterality Date  ? APPENDECTOMY    ? hemoroidectomy    ? ? ?OB History   ?No obstetric history on file. ?  ? ? ? ?Home Medications   ? ?Prior to Admission medications   ?Medication Sig Start Date End Date Taking? Authorizing Provider  ?acetaminophen (TYLENOL) 500 MG  tablet Take 500 mg by mouth every 6 (six) hours as needed.    [provider]  ?albuterol (VENTOLIN HFA) 108 (90 Base) MCG/ACT inhaler TAKE 2 PUFFS BY MOUTH EVERY 6 HOURS AS NEEDED FOR WHEEZE OR SHORTNESS OF BREATH 04/19/21   Eulogio Bear, NP  ?ALPRAZolam (XANAX) 0.5 MG tablet Take 1 tablet (0.5 mg total) by mouth at bedtime as needed for anxiety. 10/02/21   Susy Frizzle, MD  ?fluticasone (FLONASE) 50 MCG/ACT nasal spray PLACE 1 SPRAY INTO BOTH NOSTRILS DAILY AS NEEDED FOR ALLERGIES OR RHINITIS. 06/27/20   Alycia Rossetti, MD  ?loratadine (CLARITIN) 10 MG tablet Take 10 mg by mouth daily.    [provider]  ?omeprazole (PRILOSEC) 20 MG capsule TAKE 1 CAPSULE BY MOUTH EVERY DAY 10/22/21   Susy Frizzle, MD  ? ? ?Family History ?Family History  ?Problem Relation Age of Onset  ? Asthma Mother   ? Hypertension Mother   ? Stroke Mother   ? Alcohol abuse Father   ? Cancer Father   ? Alcohol abuse Brother   ? Cancer Brother   ?     lung cancer  ? ? ?Social History ?Social History  ? ?Tobacco Use  ? Smoking status: Never  ? Smokeless tobacco: Never  ?Substance Use Topics  ? Alcohol use: No  ? Drug use: No  ? ? ? ?Allergies   ?Penicillins ? ? ?Review of Systems ?Review of Systems  ?Constitutional:  Positive for activity change. Negative for appetite change, fatigue and fever.  ?Respiratory:  Negative for cough and shortness of breath.   ?Cardiovascular:  Negative for chest pain.  ?Gastrointestinal:  Negative for abdominal pain, diarrhea, nausea and vomiting.  ?Genitourinary:  Positive for frequency and urgency. Negative for vaginal bleeding, vaginal discharge and vaginal pain.  ?Psychiatric/Behavioral:  Positive for hallucinations. Negative for self-injury, sleep disturbance and suicidal ideas.   ? ? ?Physical Exam ?Triage Vital Signs ?ED Triage Vitals  ?Enc Vitals Group  ?   BP 11/16/21 1428 (!) 169/98  ?   Pulse Rate 11/16/21 1428 88  ?   Resp 11/16/21 1428 17  ?   Temp 11/16/21 1428 98.2  ?F (36.8 ?C)  ?   Temp Source 11/16/21 1428 Oral  ?   SpO2 11/16/21 1428 92 %  ?   Weight 11/16/21 1427 130 lb 1.1 oz (59 kg)  ?   Height 11/16/21 1427 '5\' 1"'$  (1.549 m)  ?   Head Circumference --   ?   Peak Flow --   ?   Pain Score 11/16/21 1427 0  ?   Pain Loc --   ?   Pain Edu? --   ?   Excl. in [place]? --   ? ?No data found. ? ?Updated Vital Signs ?BP (!) 163/109 (BP Location: Left Arm)   Pulse 88   Temp 98.2 ?F (36.8 ?C) (Oral)   Resp 17   Ht '5\' 1"'$  (1.549 m)   Wt 130 lb 1.1 oz (59 kg)   SpO2 92%   BMI 24.58 kg/m?  ? ?Visual Acuity ?Right Eye Distance:   ?Left Eye Distance:   ?Bilateral Distance:   ? ?Right Eye Near:   ?Left Eye Near:    ?Bilateral Near:    ? ?Physical Exam ?Vitals reviewed.  ?Constitutional:   ?   General: She is awake. She is not in acute distress. ?   Appearance: Normal appearance. She is well-developed. She is not ill-appearing.  ?   Comments: Very pleasant female appears stated age no acute distress sitting comfortably in exam room  ?HENT:  ?   Head: Normocephalic and atraumatic.  ?   Mouth/Throat:  ?   Pharynx: Uvula midline. No oropharyngeal exudate or posterior oropharyngeal erythema.  ?Cardiovascular:  ?   Rate and Rhythm: Normal rate and regular rhythm.  ?   Heart sounds: Normal heart sounds, S1 normal and S2 normal. No murmur heard. ?Pulmonary:  ?   Effort: Pulmonary effort is normal.  ?   Breath sounds: Normal breath sounds. No wheezing, rhonchi or rales.  ?   Comments: Clear to auscultation bilaterally ?Abdominal:  ?   General: Bowel sounds are normal.  ?   Palpations: Abdomen is soft.  ?   Tenderness: There is no abdominal tenderness. There is no right CVA tenderness, left CVA tenderness, guarding or rebound.  ?   Comments: Benign abdominal exam  ?Neurological:  ?   Mental Status: She is alert and oriented to person, place, and time.  ?Psychiatric:     ?   Behavior: Behavior is cooperative.  ?   Comments: No active visual or auditory hallucinations.  Normal appearance/hygiene.   Appropriate speech.  ? ? ? ?UC Treatments / Results  ?Labs ?(all labs ordered are listed, but only abnormal results are displayed) ?Labs Reviewed  ?COMPREHENSIVE METABOLIC PANEL - Abnormal; Notable for the following components:  ?    Result Value  ? Glucose, Bld 101 (*)   ? Alkaline Phosphatase 35 (*)   ? All other components within  normal limits  ?POCT URINALYSIS DIPSTICK, ED / UC - Abnormal; Notable for the following components:  ? Hgb urine dipstick TRACE (*)   ? All other components within normal limits  ?URINE CULTURE  ?CBC WITH DIFFERENTIAL/PLATELET  ?AMMONIA  ? ? ?EKG ? ? ?Radiology ?DG Chest 2 View ? ?Result Date: 11/16/2021 ?CLINICAL DATA:  Hallucinations EXAM: CHEST - 2 VIEW COMPARISON:  June 26, 2011 FINDINGS: Mild cardiomegaly. Thoracic aorta is ectatic. COPD changes with some flattening of the diaphragms. No focal consolidation, pleural effusion or vascular congestion. Mild thoracic spondylosis. IMPRESSION: No active cardiopulmonary disease. Electronically Signed   By: Frazier Richards M.D.   On: 11/16/2021 16:12   ? ?Procedures ?Procedures (including critical care time) ? ?Medications Ordered in UC ?Medications - No data to display ? ?Initial Impression / Assessment and Plan / UC Course  ?I have reviewed the triage vital signs and the nursing notes. ? ?Pertinent labs & imaging results that were available during my care of the patient were reviewed by me and considered in my medical decision making (see chart for details). ? ?  ? ?Patient is well-appearing, afebrile, nontoxic, nontachycardic including today.  No indication for emergent evaluation or imaging today.  She is oriented to person and place and cannot explain to me the hallucinations with insight that these are not real.  UA showed trace hemoglobin without other findings; this hematuria has been present for over 10 years.  Chest x-ray was obtained with no active cardiopulmonary disease.  CBC was appropriate with no leukocytosis or anemia.  CMP  was appropriate with normal kidney function, liver function, electrolytes.  Will obtain urine culture but defer antibiotics until results are available.  Recommended she follow-up closely with primary care to [facility]

## 2021-11-16 NOTE — ED Triage Notes (Signed)
Pt reports visual and auditory hallucinations and urinary frequency since Tuesday evening. States she believes she has an UTI.  ?

## 2021-11-16 NOTE — Discharge Instructions (Signed)
Your lab work and x-ray were normal.  I do not have a reason why you are having the symptoms.  I am sending your urine off for culture and we will contact you if this is positive and we need to start an antibiotic.  I would like you to follow-up with your primary care provider first thing next week for evaluation and to consider additional testing.  If you have any worsening symptoms over the weekend including fever, cough, shortness of breath, increased hallucinations, headache, dizziness, confusion you need to go to the emergency room immediately. ?

## 2021-11-18 LAB — URINE CULTURE

## 2021-11-20 ENCOUNTER — Encounter (HOSPITAL_COMMUNITY): Payer: Self-pay

## 2021-12-03 DIAGNOSIS — H26492 Other secondary cataract, left eye: Secondary | ICD-10-CM | POA: Diagnosis not present

## 2021-12-03 DIAGNOSIS — H52203 Unspecified astigmatism, bilateral: Secondary | ICD-10-CM | POA: Diagnosis not present

## 2021-12-03 DIAGNOSIS — Z961 Presence of intraocular lens: Secondary | ICD-10-CM | POA: Diagnosis not present

## 2021-12-07 DIAGNOSIS — H26492 Other secondary cataract, left eye: Secondary | ICD-10-CM | POA: Diagnosis not present

## 2022-02-25 ENCOUNTER — Other Ambulatory Visit: Payer: Self-pay | Admitting: Nurse Practitioner

## 2022-02-25 DIAGNOSIS — R062 Wheezing: Secondary | ICD-10-CM

## 2022-05-22 ENCOUNTER — Ambulatory Visit: Payer: PPO | Admitting: Family Medicine

## 2022-05-27 ENCOUNTER — Ambulatory Visit (INDEPENDENT_AMBULATORY_CARE_PROVIDER_SITE_OTHER): Payer: PPO | Admitting: Family Medicine

## 2022-05-27 ENCOUNTER — Encounter: Payer: Self-pay | Admitting: Family Medicine

## 2022-05-27 VITALS — BP 115/80 | HR 86 | Temp 97.8°F | Ht 61.0 in | Wt 125.0 lb

## 2022-05-27 DIAGNOSIS — F5101 Primary insomnia: Secondary | ICD-10-CM

## 2022-05-27 DIAGNOSIS — Z136 Encounter for screening for cardiovascular disorders: Secondary | ICD-10-CM | POA: Diagnosis not present

## 2022-05-27 DIAGNOSIS — Z0000 Encounter for general adult medical examination without abnormal findings: Secondary | ICD-10-CM | POA: Diagnosis not present

## 2022-05-27 DIAGNOSIS — E7800 Pure hypercholesterolemia, unspecified: Secondary | ICD-10-CM | POA: Diagnosis not present

## 2022-05-27 DIAGNOSIS — Z23 Encounter for immunization: Secondary | ICD-10-CM | POA: Diagnosis not present

## 2022-05-27 LAB — CBC WITH DIFFERENTIAL/PLATELET
Absolute Monocytes: 592 cells/uL (ref 200–950)
Basophils Absolute: 31 cells/uL (ref 0–200)
Basophils Relative: 0.5 %
Eosinophils Absolute: 61 cells/uL (ref 15–500)
Eosinophils Relative: 1 %
HCT: 37 % (ref 35.0–45.0)
Hemoglobin: 12.5 g/dL (ref 11.7–15.5)
Lymphs Abs: 1068 cells/uL (ref 850–3900)
MCH: 31 pg (ref 27.0–33.0)
MCHC: 33.8 g/dL (ref 32.0–36.0)
MCV: 91.8 fL (ref 80.0–100.0)
MPV: 10.2 fL (ref 7.5–12.5)
Monocytes Relative: 9.7 %
Neutro Abs: 4349 cells/uL (ref 1500–7800)
Neutrophils Relative %: 71.3 %
Platelets: 285 10*3/uL (ref 140–400)
RBC: 4.03 10*6/uL (ref 3.80–5.10)
RDW: 11.9 % (ref 11.0–15.0)
Total Lymphocyte: 17.5 %
WBC: 6.1 10*3/uL (ref 3.8–10.8)

## 2022-05-27 LAB — LIPID PANEL
Cholesterol: 213 mg/dL — ABNORMAL HIGH (ref <200)
HDL: 62 mg/dL (ref >=50)
LDL Cholesterol (Calc): 128 mg/dL (calc) — ABNORMAL HIGH
Non-HDL Cholesterol (Calc): 151 mg/dL (calc) — ABNORMAL HIGH (ref <130)
Total CHOL/HDL Ratio: 3.4 (calc) (ref <5.0)
Triglycerides: 122 mg/dL (ref <150)

## 2022-05-27 LAB — COMPLETE METABOLIC PANEL WITH GFR
AG Ratio: 1.6 (calc) (ref 1.0–2.5)
ALT: 8 U/L (ref 6–29)
AST: 13 U/L (ref 10–35)
Albumin: 3.9 g/dL (ref 3.6–5.1)
Alkaline phosphatase (APISO): 37 U/L (ref 37–153)
BUN: 10 mg/dL (ref 7–25)
CO2: 32 mmol/L (ref 20–32)
Calcium: 8.9 mg/dL (ref 8.6–10.4)
Chloride: 101 mmol/L (ref 98–110)
Creat: 0.69 mg/dL (ref 0.60–0.95)
Globulin: 2.5 g/dL (calc) (ref 1.9–3.7)
Glucose, Bld: 87 mg/dL (ref 65–99)
Potassium: 3.9 mmol/L (ref 3.5–5.3)
Sodium: 139 mmol/L (ref 135–146)
Total Bilirubin: 0.3 mg/dL (ref 0.2–1.2)
Total Protein: 6.4 g/dL (ref 6.1–8.1)
eGFR: 81 mL/min/{1.73_m2} (ref >=60)

## 2022-05-27 MED ORDER — ALPRAZOLAM 0.5 MG PO TABS: 0.5000 mg | ORAL_TABLET | Freq: Every evening | ORAL | 0 refills | Status: DC | PRN

## 2022-05-27 NOTE — Progress Notes (Signed)
New Patient Office Visit  Subjective    Patient ID: Rhonda Tapia, female    DOB: June 15, 1926  Age: 88 y.o. MRN: [id number]  CC:  Chief Complaint  Patient presents with   Establish Care    HPI Rhonda Tapia presents to establish care. Oriented to practice routines and expectations. She has a past medical history of COPD not on medications and Insomnia for which she takes Xanax. She has been taking 0.'5mg'$  of Xanax for when she feels "real keyed up". Last refill was 09/3021, denies falls, confusion, hallucinations, and other side effects. She previously tried Trazodone which caused hallucinations.  She reports difficulty breathing when she goes out sometimes and walks a lot. She uses an albuterol inhaler a few times a week with exertion that manages her symptoms well. Declined further testing.  She lives alone in a home and has assistance from her friend, step-daughter, and another son who she speaks to daily.  Immunizations: PNA declines, TDAP today, RSV at local pharmacy, Covid declines, flu done    Outpatient Encounter Medications as of 05/27/2022  Medication Sig   acetaminophen (TYLENOL) 500 MG tablet Take 500 mg by mouth every 6 (six) hours as needed.   albuterol (VENTOLIN HFA) 108 (90 Base) MCG/ACT inhaler TAKE 2 PUFFS BY MOUTH EVERY 6 HOURS AS NEEDED FOR WHEEZE OR SHORTNESS OF BREATH   fluticasone (FLONASE) 50 MCG/ACT nasal spray PLACE 1 SPRAY INTO BOTH NOSTRILS DAILY AS NEEDED FOR ALLERGIES OR RHINITIS.   loratadine (CLARITIN) 10 MG tablet Take 10 mg by mouth daily.   omeprazole (PRILOSEC) 20 MG capsule TAKE 1 CAPSULE BY MOUTH EVERY DAY   [DISCONTINUED] ALPRAZolam (XANAX) 0.5 MG tablet Take 1 tablet (0.5 mg total) by mouth at bedtime as needed for anxiety.   ALPRAZolam (XANAX) 0.5 MG tablet Take 1 tablet (0.5 mg total) by mouth at bedtime as needed for anxiety.   No facility-administered encounter medications on file as of 05/27/2022.    Past Medical History:  Diagnosis  Date   No pertinent past medical history    pt denies any problems   Pneumonia 06/26/11   currently   Routine general medical examination at a health care facility 07/26/2013    Past Surgical History:  Procedure Laterality Date   APPENDECTOMY     hemoroidectomy      Family History  Problem Relation Age of Onset   Asthma Mother    Hypertension Mother    Stroke Mother    Alcohol abuse Father    Cancer Father    Alcohol abuse Brother    Cancer Brother        lung cancer    Social History   Socioeconomic History   Marital status: Widowed    Spouse name: Not on file   Number of children: Not on file   Years of education: Not on file   Highest education level: Not on file  Occupational History   Not on file  Tobacco Use   Smoking status: Never   Smokeless tobacco: Never  Substance and Sexual Activity   Alcohol use: No   Drug use: No   Sexual activity: Never  Other Topics Concern   Not on file  Social History Narrative   Not on file   Social Determinants of Health   Financial Resource Strain: Not on file  Food Insecurity: Not on file  Transportation Needs: Not on file  Physical Activity: Not on file  Stress: Not on file  Social Connections:  Not on file  Intimate Partner Violence: Not on file    Review of Systems  Constitutional: Negative.   HENT: Negative.    Eyes: Negative.   Respiratory: Negative.    Cardiovascular: Negative.   Gastrointestinal: Negative.   Genitourinary: Negative.   Musculoskeletal: Negative.   Skin: Negative.   Neurological: Negative.   Endo/Heme/Allergies: Negative.   Psychiatric/Behavioral: Negative.    All other systems reviewed and are negative.       Objective    BP 115/80   Pulse 86   Temp 97.8 F (36.6 C) (Oral)   Ht '5\' 1"'$  (1.549 m)   Wt 125 lb (56.7 kg)   SpO2 95%   BMI 23.62 kg/m   Physical Exam Vitals and nursing note reviewed.  Constitutional:      Appearance: Normal appearance. She is normal weight.   HENT:     Head: Normocephalic and atraumatic.     Right Ear: Tympanic membrane, ear canal and external ear normal.     Left Ear: Tympanic membrane and ear canal normal.     Ears:      Comments: Cyst to external ear    Nose: Nose normal.     Mouth/Throat:     Mouth: Mucous membranes are moist.     Pharynx: Oropharynx is clear.  Eyes:     Extraocular Movements: Extraocular movements intact.     Conjunctiva/sclera: Conjunctivae normal.     Pupils: Pupils are equal, round, and reactive to light.  Cardiovascular:     Rate and Rhythm: Normal rate and regular rhythm.     Pulses: Normal pulses.     Heart sounds: Normal heart sounds.  Pulmonary:     Effort: Pulmonary effort is normal.     Breath sounds: Decreased breath sounds present.  Abdominal:     General: Bowel sounds are normal.     Palpations: Abdomen is soft.  Musculoskeletal:        General: Normal range of motion.     Cervical back: Normal range of motion and neck supple.  Skin:    General: Skin is warm and dry.     Capillary Refill: Capillary refill takes less than 2 seconds.  Neurological:     General: No focal deficit present.     Mental Status: She is alert and oriented to person, place, and time. Mental status is at baseline.  Psychiatric:        Mood and Affect: Mood normal.        Behavior: Behavior normal.        Thought Content: Thought content normal.        Judgment: Judgment normal.        Assessment & Plan:   1. Physical exam, annual Routine labs and physical done today. Patients has no complaints or concerns. Declines additional screenings. Consents to TDAP and has completed flu this year. Will send to local pharmacy for RSV. Declines pneumonia and covid. - CBC with Differential/Platelet - COMPLETE METABOLIC PANEL WITH GFR - Lipid panel  2. Primary insomnia She continues to experience sleeplessness due to anxiety some days. Has tried non-benzo therapy in the past with severe side effects. She is  aware of the risks of Xanax including sedation, falls, and dependence. PDMP reviewed and appropriate. She has a controlled substance agreement on file. - ALPRAZolam (XANAX) 0.5 MG tablet; Take 1 tablet (0.5 mg total) by mouth at bedtime as needed for anxiety.  Dispense: 30 tablet; Refill: 0    Return  in about 1 year (around 05/28/2023) for annual physical.   Rubie Maid, FNP

## 2022-06-18 ENCOUNTER — Encounter: Payer: Self-pay | Admitting: Family Medicine

## 2022-06-18 ENCOUNTER — Encounter: Payer: PPO | Admitting: Family Medicine

## 2022-06-18 ENCOUNTER — Ambulatory Visit (INDEPENDENT_AMBULATORY_CARE_PROVIDER_SITE_OTHER): Payer: PPO | Admitting: Family Medicine

## 2022-06-18 VITALS — BP 150/84 | HR 81 | Temp 97.4°F | Ht 61.0 in | Wt 127.0 lb

## 2022-06-18 DIAGNOSIS — Z0000 Encounter for general adult medical examination without abnormal findings: Secondary | ICD-10-CM

## 2022-06-18 DIAGNOSIS — F5101 Primary insomnia: Secondary | ICD-10-CM | POA: Diagnosis not present

## 2022-06-18 MED ORDER — ALPRAZOLAM 0.5 MG PO TABS: 0.5000 mg | ORAL_TABLET | Freq: Every evening | ORAL | 2 refills | Status: DC | PRN

## 2022-06-18 NOTE — Progress Notes (Unsigned)
Subjective:   Rhonda Tapia is a 88 y.o. female who presents for Medicare Annual (Subsequent) preventive examination.  Review of Systems     Cardiac Risk Factors include: advanced age (>29mn, >>68women)     Objective:    Today's Vitals   06/18/22 1453 06/18/22 1457  BP: (!) 160/82 (!) 150/84  Pulse: 81   Temp: (!) 97.4 F (36.3 C)   TempSrc: Oral   SpO2: 93%   Weight: 127 lb (57.6 kg)   Height: '5\' 1"'$  (1.549 m)    Body mass index is 24 kg/m.     06/18/2022    3:13 PM 04/27/2021    2:09 PM 06/26/2011    5:56 PM  Advanced Directives  Does Patient Have a Medical Advance Directive? Yes Yes Patient does not have advance directive;Patient would not like information  Type of AScientist, forensicPower of [place] will [place]  Does patient want to make changes to medical advance directive?  Yes (ED - Information included in AVS)   Copy of HConcordin Chart?  Yes - validated most recent copy scanned in chart (See row information)     Current Medications (verified) Outpatient Encounter Medications as of 06/18/2022  Medication Sig   acetaminophen (TYLENOL) 500 MG tablet Take 500 mg by mouth every 6 (six) hours as needed.   albuterol (VENTOLIN HFA) 108 (90 Base) MCG/ACT inhaler TAKE 2 PUFFS BY MOUTH EVERY 6 HOURS AS NEEDED FOR WHEEZE OR SHORTNESS OF BREATH   loratadine (CLARITIN) 10 MG tablet Take 10 mg by mouth daily.   omeprazole (PRILOSEC) 20 MG capsule TAKE 1 CAPSULE BY MOUTH EVERY DAY   [DISCONTINUED] ALPRAZolam (XANAX) 0.5 MG tablet Take 1 tablet (0.5 mg total) by mouth at bedtime as needed for anxiety.   ALPRAZolam (XANAX) 0.5 MG tablet Take 1 tablet (0.5 mg total) by mouth at bedtime as needed for anxiety.   [DISCONTINUED] fluticasone (FLONASE) 50 MCG/ACT nasal spray PLACE 1 SPRAY INTO BOTH NOSTRILS DAILY AS NEEDED FOR ALLERGIES OR RHINITIS. (Patient not taking: Reported on 06/18/2022)   No facility-administered  encounter medications on file as of 06/18/2022.    Allergies (verified) Penicillins   History: Past Medical History:  Diagnosis Date   No pertinent past medical history    pt denies any problems   Pneumonia 06/26/11   currently   Routine general medical examination at a health care facility 07/26/2013   Past Surgical History:  Procedure Laterality Date   APPENDECTOMY     hemoroidectomy     Family History  Problem Relation Age of Onset   Asthma Mother    Hypertension Mother    Stroke Mother    Alcohol abuse Father    Cancer Father    Alcohol abuse Brother    Cancer Brother        lung cancer   Social History   Socioeconomic History   Marital status: Widowed    Spouse name: Not on file   Number of children: Not on file   Years of education: Not on file   Highest education level: Not on file  Occupational History   Not on file  Tobacco Use   Smoking status: Never   Smokeless tobacco: Never  Substance and Sexual Activity   Alcohol use: No   Drug use: No   Sexual activity: Never  Other Topics Concern   Not on file  Social History Narrative   Not on file  Social Determinants of Health   Financial Resource Strain: Low Risk  (06/18/2022)   Overall Financial Resource Strain (CARDIA)    Difficulty of Paying Living Expenses: Not hard at all  Food Insecurity: No Food Insecurity (06/18/2022)   Hunger Vital Sign    Worried About Running Out of Food in the Last Year: Never true    Ran Out of Food in the Last Year: Never true  Transportation Needs: No Transportation Needs (06/18/2022)   PRAPARE - Hydrologist (Medical): No    Lack of Transportation (Non-Medical): No  Physical Activity: Insufficiently Active (06/18/2022)   Exercise Vital Sign    Days of Exercise per Week: 5 days    Minutes of Exercise per Session: 10 min  Stress: No Stress Concern Present (06/18/2022)   [place]    Feeling of Stress : Only a little  Social Connections: Socially Isolated (06/18/2022)   Social Connection and Isolation Panel [NHANES]    Frequency of Communication with Friends and Family: More than three times a week    Frequency of Social Gatherings with Friends and Family: More than three times a week    Attends Religious Services: Never    Marine scientist or Organizations: Not on file    Attends Archivist Meetings: Never    Marital Status: Widowed    Tobacco Counseling Counseling given: Not Answered   Clinical Intake:  Pre-visit preparation completed: Yes  Pain : No/denies pain     BMI - recorded: 24 Nutritional Status: BMI of 19-24  Normal Diabetes: No  How often do you need to have someone help you when you read instructions, pamphlets, or other written materials from your doctor or pharmacy?: 1 - Never What is the last grade level you completed in school?: 10  Diabetic?n/a  Interpreter Needed?: No      Activities of Daily Living    06/18/2022    3:14 PM  In your present state of health, do you have any difficulty performing the following activities:  Hearing? 1  Vision? 0  Difficulty concentrating or making decisions? 0  Walking or climbing stairs? 0  Dressing or bathing? 0  Doing errands, shopping? 1  Comment needs assistance /can't drive  Preparing Food and eating ? Y  Using the Toilet? Y  In the past six months, have you accidently leaked urine? Y  Do you have problems with loss of bowel control? N  Managing your Medications? N  Managing your Finances? N  Housekeeping or managing your Housekeeping? Y  Comment someone comes and clean her house    Patient Care Team: Rubie Maid, FNP as PCP - General (Family Medicine)  Indicate any recent Medical Services you may have received from other than [facility] providers in the past year (date may be approximate).     Assessment:   This is a routine wellness examination  for Rhonda Tapia.  Hearing/Vision screen No results found.  Dietary issues and exercise activities discussed:     Goals Addressed             This Visit's Progress    Activity and Exercise Increased       Keep on walking and staying active       Depression Screen    06/18/2022    2:46 PM 05/27/2022   11:08 AM 05/27/2022   10:53 AM 04/27/2021    2:57 PM 12/30/2020  6:71 AM 04/26/2020    9:30 AM 03/30/2019   11:22 AM  PHQ 2/9 Scores  PHQ - 2 Score 0 3 0 0 1  0  PHQ- 9 Score 0 7 0  7    Exception Documentation      Other- indicate reason in comment box     Fall Risk    06/18/2022    3:05 PM 06/18/2022    2:46 PM 05/27/2022   11:09 AM 04/27/2021    2:13 PM 03/30/2021    2:02 PM  Fall Risk   Falls in the past year? 0 0 0 0 0  Number falls in past yr:    0 0  Injury with Fall?    0 0  Follow up    Falls evaluation completed Falls evaluation completed    [place]:  Any stairs in or around the home? No  If so, are there any without handrails? Yes  Home free of loose throw rugs in walkways, pet beds, electrical cords, etc? No  Adequate lighting in your home to reduce risk of falls? No   ASSISTIVE DEVICES UTILIZED TO PREVENT FALLS:  Life alert? No  Use of a cane, walker or w/c? No  Grab bars in the bathroom? Yes  Shower chair or bench in shower? Yes  Elevated toilet seat or a handicapped toilet? No   TIMED UP AND GO:  Was the test performed? Yes .  Length of time to ambulate 10 feet: 5 sec.   Gait steady and fast with assistive device  Cognitive Function:        06/18/2022    3:18 PM 04/27/2021    2:16 PM  6CIT Screen  What Year? 0 points 0 points  What month? 0 points 0 points  What time? 0 points 0 points  Count back from 20 0 points 0 points  Months in reverse 0 points 0 points  Repeat phrase 2 points 4 points  Total Score 2 points 4 points    Immunizations Immunization History  Administered Date(s)  Administered   Fluad Quad(high Dose 65+) 03/30/2019, 04/26/2020   Influenza Whole 04/14/2013   Influenza, High Dose Seasonal PF 05/05/2014, 04/13/2015, 05/02/2016, 04/17/2017, 04/09/2018, 04/10/2018, 03/30/2021   PFIZER(Purple Top)SARS-COV-2 Vaccination 08/20/2019, 09/15/2019   Pneumococcal Conjugate-13 07/26/2013   Tdap 05/27/2022    TDAP status: Due, Education has been provided regarding the importance of this vaccine. Advised may receive this vaccine at local pharmacy or Health Dept. Aware to provide a copy of the vaccination record if obtained from local pharmacy or Health Dept. Verbalized acceptance and understanding.  Flu Vaccine status: Up to date  Pneumococcal vaccine status: Declined,  Education has been provided regarding the importance of this vaccine but patient still declined. Advised may receive this vaccine at local pharmacy or Health Dept. Aware to provide a copy of the vaccination record if obtained from local pharmacy or Health Dept. Verbalized acceptance and understanding.   Covid-19 vaccine status: Declined, Education has been provided regarding the importance of this vaccine but patient still declined. Advised may receive this vaccine at local pharmacy or Health Dept.or vaccine clinic. Aware to provide a copy of the vaccination record if obtained from local pharmacy or Health Dept. Verbalized acceptance and understanding.  Qualifies for Shingles Vaccine? No   Zostavax completed No   Shingrix Completed?: No.    Education has been provided regarding the importance of this vaccine. Patient has been advised to call insurance company  to determine out of pocket expense if they have not yet received this vaccine. Advised may also receive vaccine at local pharmacy or Health Dept. Verbalized acceptance and understanding.  Screening Tests Health Maintenance  Topic Date Due   COVID-19 Vaccine (3 - Pfizer risk series) 07/04/2022 (Originally 10/13/2019)   Zoster Vaccines- Shingrix (1  of 2) 08/27/2022 (Originally 06/12/1945)   INFLUENZA VACCINE  10/13/2022 (Originally 02/12/2022)   Pneumonia Vaccine 34+ Years old (2 - PPSV23 or PCV20) 05/28/2023 (Originally 07/26/2014)   DEXA SCAN  05/28/2023 (Originally 06/11/1993)   Medicare Annual Wellness (AWV)  06/19/2023   DTaP/Tdap/Td (2 - Td or Tdap) 05/27/2032   HPV VACCINES  Aged Out    Health Maintenance  There are no preventive care reminders to display for this patient.   Colorectal cancer screening: No longer required.   Mammogram status: No longer required due to per pt refuse.  Bone Density status: Ordered PER REFUSED. Pt provided with contact info and advised to call to schedule appt.  Lung Cancer Screening: (Low Dose CT Chest recommended if Age 51-80 years, 30 pack-year currently smoking OR have quit w/in 15years.) does not qualify.   Lung Cancer Screening Referral: N/A  Additional Screening:  Hepatitis C Screening: does not qualify; Completed N/A  Vision Screening: Recommended annual ophthalmology exams for early detection of glaucoma and other disorders of the eye. Is the patient up to date with their annual eye exam?  Yes  Who is the provider or what is the name of the office in which the patient attends annual eye exams? DR. Deliah Boston If pt is not established with a provider, would they like to be referred to a provider to establish care? No .   Dental Screening: Recommended annual dental exams for proper oral hygiene  Community Resource Referral / Chronic Care Management: CRR required this visit?  No   CCM required this visit?  No      Plan:     I have personally reviewed and noted the following in the patient's chart:   Medical and social history Use of alcohol, tobacco or illicit drugs  Current medications and supplements including opioid prescriptions. Patient is currently taking opioid prescriptions. Information provided to patient regarding non-opioid alternatives. Patient advised to discuss  non-opioid treatment plan with their provider. Functional ability and status Nutritional status Physical activity Advanced directives List of other physicians Hospitalizations, surgeries, and ER visits in previous 12 months Vitals Screenings to include cognitive, depression, and falls Referrals and appointments  In addition, I have reviewed and discussed with patient certain preventive protocols, quality metrics, and best practice recommendations. A written personalized care plan for preventive services as well as general preventive health recommendations were provided to patient.     Colman Cater, [place]   06/18/2022   Nurse Notes: N/A

## 2022-06-19 NOTE — Progress Notes (Signed)
Subjective:   Rhonda Tapia is a 88 y.o. female who presents for Medicare Annual (Subsequent) preventive examination.  Review of Systems     Cardiac Risk Factors include: advanced age (>95mn, >>25women)     Objective:    Today's Vitals   06/18/22 1453 06/18/22 1457  BP: (!) 160/82 (!) 150/84  Pulse: 81   Temp: (!) 97.4 F (36.3 C)   TempSrc: Oral   SpO2: 93%   Weight: 127 lb (57.6 kg)   Height: '5\' 1"'$  (1.549 m)    Body mass index is 24 kg/m.    06/18/2022    2:57 PM 06/18/2022    2:53 PM 05/27/2022   11:01 AM  Vitals with BMI  Height  '5\' 1"'$  '5\' 1"'$   Weight  127 lbs 125 lbs  BMI  252.84213.24 Systolic [id number] Diastolic 84 82 80  Pulse  81 86        06/18/2022    3:13 PM 04/27/2021    2:09 PM 06/26/2011    5:56 PM  Advanced Directives  Does Patient Have a Medical Advance Directive? Yes Yes Patient does not have advance directive;Patient would not like information  Type of AScientist, forensicPower of ACaptain CookLiving will [place]  Does patient want to make changes to medical advance directive?  Yes (ED - Information included in AVS)   Copy of [place]?  Yes - validated most recent copy scanned in chart (See row information)     Current Medications (verified) Outpatient Encounter Medications as of 06/18/2022  Medication Sig   acetaminophen (TYLENOL) 500 MG tablet Take 500 mg by mouth every 6 (six) hours as needed.   albuterol (VENTOLIN HFA) 108 (90 Base) MCG/ACT inhaler TAKE 2 PUFFS BY MOUTH EVERY 6 HOURS AS NEEDED FOR WHEEZE OR SHORTNESS OF BREATH   loratadine (CLARITIN) 10 MG tablet Take 10 mg by mouth daily.   omeprazole (PRILOSEC) 20 MG capsule TAKE 1 CAPSULE BY MOUTH EVERY DAY   [DISCONTINUED] ALPRAZolam (XANAX) 0.5 MG tablet Take 1 tablet (0.5 mg total) by mouth at bedtime as needed for anxiety.   ALPRAZolam (XANAX) 0.5 MG tablet Take 1 tablet (0.5 mg total) by mouth at bedtime as needed for  anxiety.   [DISCONTINUED] fluticasone (FLONASE) 50 MCG/ACT nasal spray PLACE 1 SPRAY INTO BOTH NOSTRILS DAILY AS NEEDED FOR ALLERGIES OR RHINITIS. (Patient not taking: Reported on 06/18/2022)   No facility-administered encounter medications on file as of 06/18/2022.    Allergies (verified) Penicillins   History: Past Medical History:  Diagnosis Date   No pertinent past medical history    pt denies any problems   Pneumonia 06/26/11   currently   Routine general medical examination at a health care facility 07/26/2013   Past Surgical History:  Procedure Laterality Date   APPENDECTOMY     hemoroidectomy     Family History  Problem Relation Age of Onset   Asthma Mother    Hypertension Mother    Stroke Mother    Alcohol abuse Father    Cancer Father    Alcohol abuse Brother    Cancer Brother        lung cancer   Social History   Socioeconomic History   Marital status: Widowed    Spouse name: Not on file   Number of children: Not on file   Years of education: Not on file   Highest education level: Not on  file  Occupational History   Not on file  Tobacco Use   Smoking status: Never   Smokeless tobacco: Never  Substance and Sexual Activity   Alcohol use: No   Drug use: No   Sexual activity: Never  Other Topics Concern   Not on file  Social History Narrative   Not on file   Social Determinants of Health   Financial Resource Strain: Low Risk  (06/18/2022)   Overall Financial Resource Strain (CARDIA)    Difficulty of Paying Living Expenses: Not hard at all  Food Insecurity: No Food Insecurity (06/18/2022)   Hunger Vital Sign    Worried About Running Out of Food in the Last Year: Never true    Ran Out of Food in the Last Year: Never true  Transportation Needs: No Transportation Needs (06/18/2022)   PRAPARE - Hydrologist (Medical): No    Lack of Transportation (Non-Medical): No  Physical Activity: Insufficiently Active (06/18/2022)    Exercise Vital Sign    Days of Exercise per Week: 5 days    Minutes of Exercise per Session: 10 min  Stress: No Stress Concern Present (06/18/2022)   [place]    Feeling of Stress : Only a little  Social Connections: Socially Isolated (06/18/2022)   Social Connection and Isolation Panel [NHANES]    Frequency of Communication with Friends and Family: More than three times a week    Frequency of Social Gatherings with Friends and Family: More than three times a week    Attends Religious Services: Never    Marine scientist or Organizations: Not on file    Attends Archivist Meetings: Never    Marital Status: Widowed    Tobacco Counseling Counseling given: Not Answered   Clinical Intake:  Pre-visit preparation completed: Yes  Pain : No/denies pain     BMI - recorded: 24 Nutritional Status: BMI of 19-24  Normal Diabetes: No  How often do you need to have someone help you when you read instructions, pamphlets, or other written materials from your doctor or pharmacy?: 1 - Never What is the last grade level you completed in school?: 10  Diabetic?n/a  Interpreter Needed?: No      Activities of Daily Living    06/18/2022    3:14 PM  In your present state of health, do you have any difficulty performing the following activities:  Hearing? 1  Vision? 0  Difficulty concentrating or making decisions? 0  Walking or climbing stairs? 0  Dressing or bathing? 0  Doing errands, shopping? 1  Comment needs assistance /can't drive  Preparing Food and eating ? Y  Using the Toilet? Y  In the past six months, have you accidently leaked urine? Y  Do you have problems with loss of bowel control? N  Managing your Medications? N  Managing your Finances? N  Housekeeping or managing your Housekeeping? Y  Comment someone comes and clean her house    Patient Care Team: Rubie Maid, FNP as PCP - General  (Family Medicine)  Indicate any recent Medical Services you may have received from other than [facility] providers in the past year (date may be approximate).     Assessment:   This is a routine wellness examination for Rhonda Tapia.  Hearing/Vision screen No results found.  Dietary issues and exercise activities discussed:     Goals Addressed  This Visit's Progress    Activity and Exercise Increased       Keep on walking and staying active      Depression Screen    06/18/2022    2:46 PM 05/27/2022   11:08 AM 05/27/2022   10:53 AM 04/27/2021    2:57 PM 12/30/2020    6:57 AM 04/26/2020    9:30 AM 03/30/2019   11:22 AM  PHQ 2/9 Scores  PHQ - 2 Score 0 3 0 0 1  0  PHQ- 9 Score 0 7 0  7    Exception Documentation      Other- indicate reason in comment box     Fall Risk    06/18/2022    3:05 PM 06/18/2022    2:46 PM 05/27/2022   11:09 AM 04/27/2021    2:13 PM 03/30/2021    2:02 PM  Fall Risk   Falls in the past year? 0 0 0 0 0  Number falls in past yr:    0 0  Injury with Fall?    0 0  Follow up    Falls evaluation completed Falls evaluation completed    [place]:  Any stairs in or around the home? No  If so, are there any without handrails? Yes  Home free of loose throw rugs in walkways, pet beds, electrical cords, etc? No  Adequate lighting in your home to reduce risk of falls? No   ASSISTIVE DEVICES UTILIZED TO PREVENT FALLS:  Life alert? No  Use of a cane, walker or w/c? No  Grab bars in the bathroom? Yes  Shower chair or bench in shower? Yes  Elevated toilet seat or a handicapped toilet? No   TIMED UP AND GO:  Was the test performed? Yes .  Length of time to ambulate 10 feet: 5 sec.   Gait steady and fast with assistive device  Cognitive Function:        06/18/2022    3:18 PM 04/27/2021    2:16 PM  6CIT Screen  What Year? 0 points 0 points  What month? 0 points 0 points  What time? 0 points 0 points   Count back from 20 0 points 0 points  Months in reverse 0 points 0 points  Repeat phrase 2 points 4 points  Total Score 2 points 4 points    Immunizations Immunization History  Administered Date(s) Administered   Fluad Quad(high Dose 65+) 03/30/2019, 04/26/2020   Influenza Whole 04/14/2013   Influenza, High Dose Seasonal PF 05/05/2014, 04/13/2015, 05/02/2016, 04/17/2017, 04/09/2018, 04/10/2018, 03/30/2021   PFIZER(Purple Top)SARS-COV-2 Vaccination 08/20/2019, 09/15/2019   Pneumococcal Conjugate-13 07/26/2013   Tdap 05/27/2022    TDAP status: Due, Education has been provided regarding the importance of this vaccine. Advised may receive this vaccine at local pharmacy or Health Dept. Aware to provide a copy of the vaccination record if obtained from local pharmacy or Health Dept. Verbalized acceptance and understanding.  Flu Vaccine status: Up to date  Pneumococcal vaccine status: Declined,  Education has been provided regarding the importance of this vaccine but patient still declined. Advised may receive this vaccine at local pharmacy or Health Dept. Aware to provide a copy of the vaccination record if obtained from local pharmacy or Health Dept. Verbalized acceptance and understanding.   Covid-19 vaccine status: Declined, Education has been provided regarding the importance of this vaccine but patient still declined. Advised may receive this vaccine at local pharmacy or Health Dept.or vaccine clinic.  Aware to provide a copy of the vaccination record if obtained from local pharmacy or Health Dept. Verbalized acceptance and understanding.  Qualifies for Shingles Vaccine? No   Zostavax completed No   Shingrix Completed?: No.    Education has been provided regarding the importance of this vaccine. Patient has been advised to call insurance company to determine out of pocket expense if they have not yet received this vaccine. Advised may also receive vaccine at local pharmacy or Health Dept.  Verbalized acceptance and understanding.  Screening Tests Health Maintenance  Topic Date Due   COVID-19 Vaccine (3 - Pfizer risk series) 07/04/2022 (Originally 10/13/2019)   Zoster Vaccines- Shingrix (1 of 2) 08/27/2022 (Originally 06/12/1945)   INFLUENZA VACCINE  10/13/2022 (Originally 02/12/2022)   Pneumonia Vaccine 55+ Years old (2 - PPSV23 or PCV20) 05/28/2023 (Originally 07/26/2014)   DEXA SCAN  05/28/2023 (Originally 06/11/1993)   Medicare Annual Wellness (AWV)  06/19/2023   DTaP/Tdap/Td (2 - Td or Tdap) 05/27/2032   HPV VACCINES  Aged Out    Health Maintenance  There are no preventive care reminders to display for this patient.   Colorectal cancer screening: No longer required.   Mammogram status: No longer required due to per pt refuse.  Bone Density status: Ordered PER REFUSED. Pt provided with contact info and advised to call to schedule appt.  Lung Cancer Screening: (Low Dose CT Chest recommended if Age 51-80 years, 30 pack-year currently smoking OR have quit w/in 15years.) does not qualify.   Lung Cancer Screening Referral: N/A  Additional Screening:  Hepatitis C Screening: does not qualify; Completed N/A  Vision Screening: Recommended annual ophthalmology exams for early detection of glaucoma and other disorders of the eye. Is the patient up to date with their annual eye exam?  Yes  Who is the provider or what is the name of the office in which the patient attends annual eye exams? DR. Deliah Boston If pt is not established with a provider, would they like to be referred to a provider to establish care? No .   Dental Screening: Recommended annual dental exams for proper oral hygiene  Community Resource Referral / Chronic Care Management: CRR required this visit?  No   CCM required this visit?  No      Plan:     I have personally reviewed and noted the following in the patient's chart:   Medical and social history Use of alcohol, tobacco or illicit drugs   Current medications and supplements including opioid prescriptions. Patient is currently taking opioid prescriptions. Information provided to patient regarding non-opioid alternatives. Patient advised to discuss non-opioid treatment plan with their provider. Functional ability and status Nutritional status Physical activity Advanced directives List of other physicians Hospitalizations, surgeries, and ER visits in previous 12 months Vitals Screenings to include cognitive, depression, and falls Referrals and appointments  In addition, I have reviewed and discussed with patient certain preventive protocols, quality metrics, and best practice recommendations. A written personalized care plan for preventive services as well as general preventive health recommendations were provided to patient.   Patient's BP was elevated today in office, she reported being anxious and worried regarding her visit today. She was originally scheduled for an annual physical that was completed on 11/13 so her AWV was completed instead. Historically her BP is well controlled. She will monitor at home and report values if sustained greater than 130/80.  Mila Merry, [place]  06/19/2022   Nurse Notes: N/A

## 2022-06-19 NOTE — Progress Notes (Deleted)
Subjective:   Rhonda Tapia is a 88 y.o. female who presents for Medicare Annual (Subsequent) preventive examination.  Review of Systems     Cardiac Risk Factors include: advanced age (>47mn, >>21women)     Objective:    Today's Vitals   06/18/22 1453 06/18/22 1457  BP: (!) 160/82 (!) 150/84  Pulse: 81   Temp: (!) 97.4 F (36.3 C)   TempSrc: Oral   SpO2: 93%   Weight: 127 lb (57.6 kg)   Height: '5\' 1"'$  (1.549 m)    Body mass index is 24 kg/m.    06/18/2022    2:57 PM 06/18/2022    2:53 PM 05/27/2022   11:01 AM  Vitals with BMI  Height  '5\' 1"'$  '5\' 1"'$   Weight  127 lbs 125 lbs  BMI  240.34274.25 Systolic [id number] Diastolic 84 82 80  Pulse  81 86        06/18/2022    3:13 PM 04/27/2021    2:09 PM 06/26/2011    5:56 PM  Advanced Directives  Does Patient Have a Medical Advance Directive? Yes Yes Patient does not have advance directive;Patient would not like information  Type of AScientist, forensicPower of ADowsLiving will [place]  Does patient want to make changes to medical advance directive?  Yes (ED - Information included in AVS)   Copy of HMehamain Chart?  Yes - validated most recent copy scanned in chart (See row information)     Current Medications (verified) Outpatient Encounter Medications as of 06/18/2022  Medication Sig   acetaminophen (TYLENOL) 500 MG tablet Take 500 mg by mouth every 6 (six) hours as needed.   albuterol (VENTOLIN HFA) 108 (90 Base) MCG/ACT inhaler TAKE 2 PUFFS BY MOUTH EVERY 6 HOURS AS NEEDED FOR WHEEZE OR SHORTNESS OF BREATH   loratadine (CLARITIN) 10 MG tablet Take 10 mg by mouth daily.   omeprazole (PRILOSEC) 20 MG capsule TAKE 1 CAPSULE BY MOUTH EVERY DAY   [DISCONTINUED] ALPRAZolam (XANAX) 0.5 MG tablet Take 1 tablet (0.5 mg total) by mouth at bedtime as needed for anxiety.   ALPRAZolam (XANAX) 0.5 MG tablet Take 1 tablet (0.5 mg total) by mouth at bedtime as needed for  anxiety.   [DISCONTINUED] fluticasone (FLONASE) 50 MCG/ACT nasal spray PLACE 1 SPRAY INTO BOTH NOSTRILS DAILY AS NEEDED FOR ALLERGIES OR RHINITIS. (Patient not taking: Reported on 06/18/2022)   No facility-administered encounter medications on file as of 06/18/2022.    Allergies (verified) Penicillins   History: Past Medical History:  Diagnosis Date   No pertinent past medical history    pt denies any problems   Pneumonia 06/26/11   currently   Routine general medical examination at a health care facility 07/26/2013   Past Surgical History:  Procedure Laterality Date   APPENDECTOMY     hemoroidectomy     Family History  Problem Relation Age of Onset   Asthma Mother    Hypertension Mother    Stroke Mother    Alcohol abuse Father    Cancer Father    Alcohol abuse Brother    Cancer Brother        lung cancer   Social History   Socioeconomic History   Marital status: Widowed    Spouse name: Not on file   Number of children: Not on file   Years of education: Not on file   Highest education level: Not on  file  Occupational History   Not on file  Tobacco Use   Smoking status: Never   Smokeless tobacco: Never  Substance and Sexual Activity   Alcohol use: No   Drug use: No   Sexual activity: Never  Other Topics Concern   Not on file  Social History Narrative   Not on file   Social Determinants of Health   Financial Resource Strain: Low Risk  (06/18/2022)   Overall Financial Resource Strain (CARDIA)    Difficulty of Paying Living Expenses: Not hard at all  Food Insecurity: No Food Insecurity (06/18/2022)   Hunger Vital Sign    Worried About Running Out of Food in the Last Year: Never true    Ran Out of Food in the Last Year: Never true  Transportation Needs: No Transportation Needs (06/18/2022)   PRAPARE - Hydrologist (Medical): No    Lack of Transportation (Non-Medical): No  Physical Activity: Insufficiently Active (06/18/2022)    Exercise Vital Sign    Days of Exercise per Week: 5 days    Minutes of Exercise per Session: 10 min  Stress: No Stress Concern Present (06/18/2022)   [place]    Feeling of Stress : Only a little  Social Connections: Socially Isolated (06/18/2022)   Social Connection and Isolation Panel [NHANES]    Frequency of Communication with Friends and Family: More than three times a week    Frequency of Social Gatherings with Friends and Family: More than three times a week    Attends Religious Services: Never    Marine scientist or Organizations: Not on file    Attends Archivist Meetings: Never    Marital Status: Widowed    Tobacco Counseling Counseling given: Not Answered   Clinical Intake:  Pre-visit preparation completed: Yes  Pain : No/denies pain     BMI - recorded: 24 Nutritional Status: BMI of 19-24  Normal Diabetes: No  How often do you need to have someone help you when you read instructions, pamphlets, or other written materials from your doctor or pharmacy?: 1 - Never What is the last grade level you completed in school?: 10  Diabetic?n/a  Interpreter Needed?: No      Activities of Daily Living    06/18/2022    3:14 PM  In your present state of health, do you have any difficulty performing the following activities:  Hearing? 1  Vision? 0  Difficulty concentrating or making decisions? 0  Walking or climbing stairs? 0  Dressing or bathing? 0  Doing errands, shopping? 1  Comment needs assistance /can't drive  Preparing Food and eating ? Y  Using the Toilet? Y  In the past six months, have you accidently leaked urine? Y  Do you have problems with loss of bowel control? N  Managing your Medications? N  Managing your Finances? N  Housekeeping or managing your Housekeeping? Y  Comment someone comes and clean her house    Patient Care Team: Rubie Maid, FNP as PCP - General  (Family Medicine)  Indicate any recent Medical Services you may have received from other than [facility] providers in the past year (date may be approximate).     Assessment:   This is a routine wellness examination for Rhonda Tapia.  Hearing/Vision screen No results found.  Dietary issues and exercise activities discussed:     Goals Addressed  This Visit's Progress    Activity and Exercise Increased       Keep on walking and staying active      Depression Screen    06/18/2022    2:46 PM 05/27/2022   11:08 AM 05/27/2022   10:53 AM 04/27/2021    2:57 PM 12/30/2020    6:57 AM 04/26/2020    9:30 AM 03/30/2019   11:22 AM  PHQ 2/9 Scores  PHQ - 2 Score 0 3 0 0 1  0  PHQ- 9 Score 0 7 0  7    Exception Documentation      Other- indicate reason in comment box     Fall Risk    06/18/2022    3:05 PM 06/18/2022    2:46 PM 05/27/2022   11:09 AM 04/27/2021    2:13 PM 03/30/2021    2:02 PM  Fall Risk   Falls in the past year? 0 0 0 0 0  Number falls in past yr:    0 0  Injury with Fall?    0 0  Follow up    Falls evaluation completed Falls evaluation completed    [place]:  Any stairs in or around the home? No  If so, are there any without handrails? Yes  Home free of loose throw rugs in walkways, pet beds, electrical cords, etc? No  Adequate lighting in your home to reduce risk of falls? No   ASSISTIVE DEVICES UTILIZED TO PREVENT FALLS:  Life alert? No  Use of a cane, walker or w/c? No  Grab bars in the bathroom? Yes  Shower chair or bench in shower? Yes  Elevated toilet seat or a handicapped toilet? No   TIMED UP AND GO:  Was the test performed? Yes .  Length of time to ambulate 10 feet: 5 sec.   Gait steady and fast with assistive device  Cognitive Function:        06/18/2022    3:18 PM 04/27/2021    2:16 PM  6CIT Screen  What Year? 0 points 0 points  What month? 0 points 0 points  What time? 0 points 0 points   Count back from 20 0 points 0 points  Months in reverse 0 points 0 points  Repeat phrase 2 points 4 points  Total Score 2 points 4 points    Immunizations Immunization History  Administered Date(s) Administered   Fluad Quad(high Dose 65+) 03/30/2019, 04/26/2020   Influenza Whole 04/14/2013   Influenza, High Dose Seasonal PF 05/05/2014, 04/13/2015, 05/02/2016, 04/17/2017, 04/09/2018, 04/10/2018, 03/30/2021   PFIZER(Purple Top)SARS-COV-2 Vaccination 08/20/2019, 09/15/2019   Pneumococcal Conjugate-13 07/26/2013   Tdap 05/27/2022    TDAP status: Due, Education has been provided regarding the importance of this vaccine. Advised may receive this vaccine at local pharmacy or Health Dept. Aware to provide a copy of the vaccination record if obtained from local pharmacy or Health Dept. Verbalized acceptance and understanding.  Flu Vaccine status: Up to date  Pneumococcal vaccine status: Declined,  Education has been provided regarding the importance of this vaccine but patient still declined. Advised may receive this vaccine at local pharmacy or Health Dept. Aware to provide a copy of the vaccination record if obtained from local pharmacy or Health Dept. Verbalized acceptance and understanding.   Covid-19 vaccine status: Declined, Education has been provided regarding the importance of this vaccine but patient still declined. Advised may receive this vaccine at local pharmacy or Health Dept.or vaccine clinic.  Aware to provide a copy of the vaccination record if obtained from local pharmacy or Health Dept. Verbalized acceptance and understanding.  Qualifies for Shingles Vaccine? No   Zostavax completed No   Shingrix Completed?: No.    Education has been provided regarding the importance of this vaccine. Patient has been advised to call insurance company to determine out of pocket expense if they have not yet received this vaccine. Advised may also receive vaccine at local pharmacy or Health Dept.  Verbalized acceptance and understanding.  Screening Tests Health Maintenance  Topic Date Due   COVID-19 Vaccine (3 - Pfizer risk series) 07/04/2022 (Originally 10/13/2019)   Zoster Vaccines- Shingrix (1 of 2) 08/27/2022 (Originally 06/12/1945)   INFLUENZA VACCINE  10/13/2022 (Originally 02/12/2022)   Pneumonia Vaccine 21+ Years old (2 - PPSV23 or PCV20) 05/28/2023 (Originally 07/26/2014)   DEXA SCAN  05/28/2023 (Originally 06/11/1993)   Medicare Annual Wellness (AWV)  06/19/2023   DTaP/Tdap/Td (2 - Td or Tdap) 05/27/2032   HPV VACCINES  Aged Out    Health Maintenance  There are no preventive care reminders to display for this patient.   Colorectal cancer screening: No longer required.   Mammogram status: No longer required due to per pt refuse.  Bone Density status: Ordered PER REFUSED. Pt provided with contact info and advised to call to schedule appt.  Lung Cancer Screening: (Low Dose CT Chest recommended if Age 26-80 years, 30 pack-year currently smoking OR have quit w/in 15years.) does not qualify.   Lung Cancer Screening Referral: N/A  Additional Screening:  Hepatitis C Screening: does not qualify; Completed N/A  Vision Screening: Recommended annual ophthalmology exams for early detection of glaucoma and other disorders of the eye. Is the patient up to date with their annual eye exam?  Yes  Who is the provider or what is the name of the office in which the patient attends annual eye exams? DR. Deliah Boston If pt is not established with a provider, would they like to be referred to a provider to establish care? No .   Dental Screening: Recommended annual dental exams for proper oral hygiene  Community Resource Referral / Chronic Care Management: CRR required this visit?  No   CCM required this visit?  No      Plan:     I have personally reviewed and noted the following in the patient's chart:   Medical and social history Use of alcohol, tobacco or illicit drugs   Current medications and supplements including opioid prescriptions. Patient is currently taking opioid prescriptions. Information provided to patient regarding non-opioid alternatives. Patient advised to discuss non-opioid treatment plan with their provider. Functional ability and status Nutritional status Physical activity Advanced directives List of other physicians Hospitalizations, surgeries, and ER visits in previous 12 months Vitals Screenings to include cognitive, depression, and falls Referrals and appointments  In addition, I have reviewed and discussed with patient certain preventive protocols, quality metrics, and best practice recommendations. A written personalized care plan for preventive services as well as general preventive health recommendations were provided to patient.   Patient's BP was elevated today in office, she reported being anxious and worried regarding her visit today. She was originally scheduled for an annual physical that was completed on 11/13 so her AWV was completed instead. Historically her BP is well controlled. She will monitor at home and report values if sustained greater than 130/80.  Mila Merry, [place]  06/19/2022   Nurse Notes: N/A

## 2022-08-26 ENCOUNTER — Other Ambulatory Visit: Payer: Self-pay | Admitting: Nurse Practitioner

## 2022-08-26 DIAGNOSIS — R062 Wheezing: Secondary | ICD-10-CM

## 2022-08-26 NOTE — Telephone Encounter (Signed)
This meds has expired in 2022?  Advice pls

## 2022-09-23 ENCOUNTER — Telehealth: Payer: Self-pay | Admitting: Family Medicine

## 2022-09-23 NOTE — Telephone Encounter (Signed)
Daughter dropped off handicap placard for patient. Placed in [place] folder.    Please call daughter for pick up Rhonda Tapia [phone]

## 2022-09-25 ENCOUNTER — Telehealth: Payer: Self-pay | Admitting: Family Medicine

## 2022-09-25 ENCOUNTER — Other Ambulatory Visit: Payer: Self-pay | Admitting: Family Medicine

## 2022-09-25 DIAGNOSIS — J4520 Mild intermittent asthma, uncomplicated: Secondary | ICD-10-CM

## 2022-09-25 DIAGNOSIS — F5101 Primary insomnia: Secondary | ICD-10-CM

## 2022-09-25 MED ORDER — ALPRAZOLAM 0.5 MG PO TABS: 0.5000 mg | ORAL_TABLET | Freq: Every evening | ORAL | 0 refills | Status: DC | PRN

## 2022-09-25 MED ORDER — OMEPRAZOLE 20 MG PO CPDR: 20.0000 mg | DELAYED_RELEASE_CAPSULE | Freq: Every day | ORAL | 0 refills | Status: DC

## 2022-09-25 NOTE — Telephone Encounter (Signed)
Prescription Request  09/25/2022  LOV: 05/27/2022  What is the name of the medication or equipment?   ALPRAZolam (XANAX) 0.5 MG tablet [id number] - ** HAS ONE TABLET LEFT**  omeprazole (PRILOSEC) 20 MG capsule [id number] ** OUT OF MEDICATION**  Have you contacted your pharmacy to request a refill? Yes   Which pharmacy would you like this sent to?  [id number] [address]: [phone]: [phone]   Patient notified that their request is being sent to the clinical staff for review and that they should receive a response within 2 business days.   Please advise at [phone]

## 2022-09-25 NOTE — Progress Notes (Signed)
Pt is aware of risks of psychoactive medication use to include increased sedation, respiratory suppression, falls, extrapyramidal movements, dependence and cardiovascular events. Pt would like to continue treatment as benefit determined to outweigh risk. PDMP reviewed and appropriate and refill given for 1 months. UDS done. Controlled substance agreement signed previously. Follow up in 3 months.

## 2022-10-01 NOTE — Telephone Encounter (Signed)
LVM for pt's daughter re placard ready for pick up. Put in file cabinet up front for pt

## 2022-10-08 NOTE — Telephone Encounter (Signed)
Spoke with pt's friend, Hector Brunswick, [phone], re ALPRAZolam Duanne Moron) 0.5 MG tablet [id number] - ** HAS ONE TABLET LEFT**   omeprazole (PRILOSEC) 20 MG capsule [id number]  Advise pt's friend that, w/Xanax there is no refills so pt would have to call a day before to request for refills.   As for the Prilosec, pt has 90DS

## 2022-10-28 ENCOUNTER — Other Ambulatory Visit: Payer: Self-pay | Admitting: Family Medicine

## 2022-10-28 DIAGNOSIS — J4520 Mild intermittent asthma, uncomplicated: Secondary | ICD-10-CM

## 2022-10-28 DIAGNOSIS — F5101 Primary insomnia: Secondary | ICD-10-CM

## 2022-10-28 DIAGNOSIS — R062 Wheezing: Secondary | ICD-10-CM

## 2022-11-04 ENCOUNTER — Ambulatory Visit (INDEPENDENT_AMBULATORY_CARE_PROVIDER_SITE_OTHER): Payer: PPO | Admitting: Family Medicine

## 2022-11-04 ENCOUNTER — Encounter: Payer: Self-pay | Admitting: Family Medicine

## 2022-11-04 VITALS — BP 140/90 | HR 97 | Temp 97.8°F | Ht 61.0 in | Wt 129.0 lb

## 2022-11-04 DIAGNOSIS — Z79899 Other long term (current) drug therapy: Secondary | ICD-10-CM

## 2022-11-04 DIAGNOSIS — F5101 Primary insomnia: Secondary | ICD-10-CM

## 2022-11-04 MED ORDER — ALPRAZOLAM 0.5 MG PO TABS: 0.5000 mg | ORAL_TABLET | Freq: Every evening | ORAL | 2 refills | Status: DC | PRN

## 2022-11-04 NOTE — Assessment & Plan Note (Signed)
Chronic. Pt is aware of risks of psychoactive medication use to include increased sedation, respiratory suppression, falls, extrapyramidal movements,  dependence and cardiovascular events.  Pt would like to continue treatment as benefit determined to outweigh risk.   Controlled substance agreement has been signed in the past.  UDS today.  PDMP reviewed and appropriate - will send in refill of alprazolam 0.5 mg qhs prn #30 with 2 refills.  Patient to continue to monitor for side effects closely.  Follow up in 3 months.

## 2022-11-04 NOTE — Progress Notes (Signed)
Acute Office Visit  Subjective:     Patient ID: Rhonda Tapia, female    DOB: Feb 04, 1926, 89 y.o.   MRN: [id number]  Chief Complaint  Patient presents with   Follow-up    needing to discuss meds/refills      HPI Patient is in today to discuss medications and refills. Pt is aware of risks of psychoactive medication use to include increased sedation, respiratory suppression, falls, extrapyramidal movements, dependence and cardiovascular events. Pt would like to continue treatment as benefit determined to outweigh risk. PDMP reviewed and appropriate and refill given for 3 months. UDS done. Controlled substance agreement signed previously. Follow up in 3 months.   Review of Systems  Neurological: Negative.   Psychiatric/Behavioral:  The patient has insomnia.   All other systems reviewed and are negative.   Past Medical History:  Diagnosis Date   No pertinent past medical history    pt denies any problems   Pneumonia 06/26/11   currently   Routine general medical examination at a health care facility 07/26/2013    Past Surgical History:  Procedure Laterality Date   APPENDECTOMY     hemoroidectomy     Current Outpatient Medications on File Prior to Visit  Medication Sig Dispense Refill   acetaminophen (TYLENOL) 500 MG tablet Take 500 mg by mouth every 6 (six) hours as needed.     albuterol (VENTOLIN HFA) 108 (90 Base) MCG/ACT inhaler TAKE 2 PUFFS INTO THE LUNGS UP TO EVERY 6HRS AS NEEDED FOR WHEEZE OR SHORTNESS OF BREATH 8.5 each 1   cetirizine (ZYRTEC) 5 MG tablet Take 5 mg by mouth daily.     loratadine (CLARITIN) 10 MG tablet Take 10 mg by mouth daily.     omeprazole (PRILOSEC) 20 MG capsule Take 1 capsule (20 mg total) by mouth daily. 90 capsule 0   No current facility-administered medications on file prior to visit.   Allergies  Allergen Reactions   Penicillins Hives       Objective:    BP (!) 140/90   Pulse 97   Temp 97.8 F (36.6 C) (Oral)   Ht   (1.549 m)   Wt 129 lb (58.5 kg)   SpO2 97%   BMI 24.37 kg/m    Physical Exam Vitals and nursing note reviewed.  Constitutional:      Appearance: Normal appearance. She is normal weight.  HENT:     Head: Normocephalic and atraumatic.  Skin:    General: Skin is warm and dry.  Neurological:     General: No focal deficit present.     Mental Status: She is alert and oriented to person, place, and time. Mental status is at baseline.  Psychiatric:        Mood and Affect: Mood normal.        Behavior: Behavior normal.        Thought Content: Thought content normal.        Judgment: Judgment normal.     No results found for any visits on 11/04/22.      Assessment & Plan:   Problem List Items Addressed This Visit     Insomnia    Chronic. Pt is aware of risks of psychoactive medication use to include increased sedation, respiratory suppression, falls, extrapyramidal movements,  dependence and cardiovascular events.  Pt would like to continue treatment as benefit determined to outweigh risk.   Controlled substance agreement has been signed in the past.  UDS today.  PDMP reviewed and  appropriate - will send in refill of alprazolam 0.5 mg qhs prn #30 with 2 refills.  Patient to continue to monitor for side effects closely.  Follow up in 3 months.       Relevant Medications   ALPRAZolam (XANAX) 0.5 MG tablet   Controlled substance agreement signed - Primary   Relevant Orders   DRUG MONITOR, PANEL 1, W/CONF, URINE    Meds ordered this encounter  Medications   ALPRAZolam (XANAX) 0.5 MG tablet    Sig: Take 1 tablet (0.5 mg total) by mouth at bedtime as needed for anxiety.    Dispense:  30 tablet    Refill:  2    Order Specific Question:   Supervising Provider    Answer:   Lynnea Ferrier T [[id number]]    Return in about 3 months (around 02/03/2023) for insomnia.  [name], FNP

## 2022-11-06 LAB — DRUG MONITOR, PANEL 1, W/CONF, URINE
Alphahydroxyalprazolam: 243 ng/mL — ABNORMAL HIGH (ref <25)
Alphahydroxymidazolam: NEGATIVE ng/mL (ref <50)
Alphahydroxytriazolam: NEGATIVE ng/mL (ref <50)
Aminoclonazepam: NEGATIVE ng/mL (ref <25)
Amphetamines: NEGATIVE ng/mL (ref <500)
Barbiturates: NEGATIVE ng/mL (ref <300)
Benzodiazepines: POSITIVE ng/mL — AB (ref <100)
Cocaine Metabolite: NEGATIVE ng/mL (ref <150)
Creatinine: 171.7 mg/dL (ref >=20.0)
Hydroxyethylflurazepam: NEGATIVE ng/mL (ref <50)
Lorazepam: NEGATIVE ng/mL (ref <50)
Marijuana Metabolite: NEGATIVE ng/mL (ref <20)
Methadone Metabolite: NEGATIVE ng/mL (ref <100)
Nordiazepam: NEGATIVE ng/mL (ref <50)
Opiates: NEGATIVE ng/mL (ref <100)
Oxazepam: NEGATIVE ng/mL (ref <50)
Oxidant: NEGATIVE ug/mL (ref <200)
Oxycodone: NEGATIVE ng/mL (ref <100)
Phencyclidine: NEGATIVE ng/mL (ref <25)
Temazepam: NEGATIVE ng/mL (ref <50)
pH: 6.2 (ref 4.5–9.0)

## 2022-11-06 LAB — DM TEMPLATE

## 2023-01-05 IMAGING — DX DG CHEST 2V
2 series · 2 of 2 positions shown · non-contrast
Comparison: June 26, 2011

CLINICAL DATA: Hallucinations

EXAM:
CHEST - 2 VIEW

[chest pa]
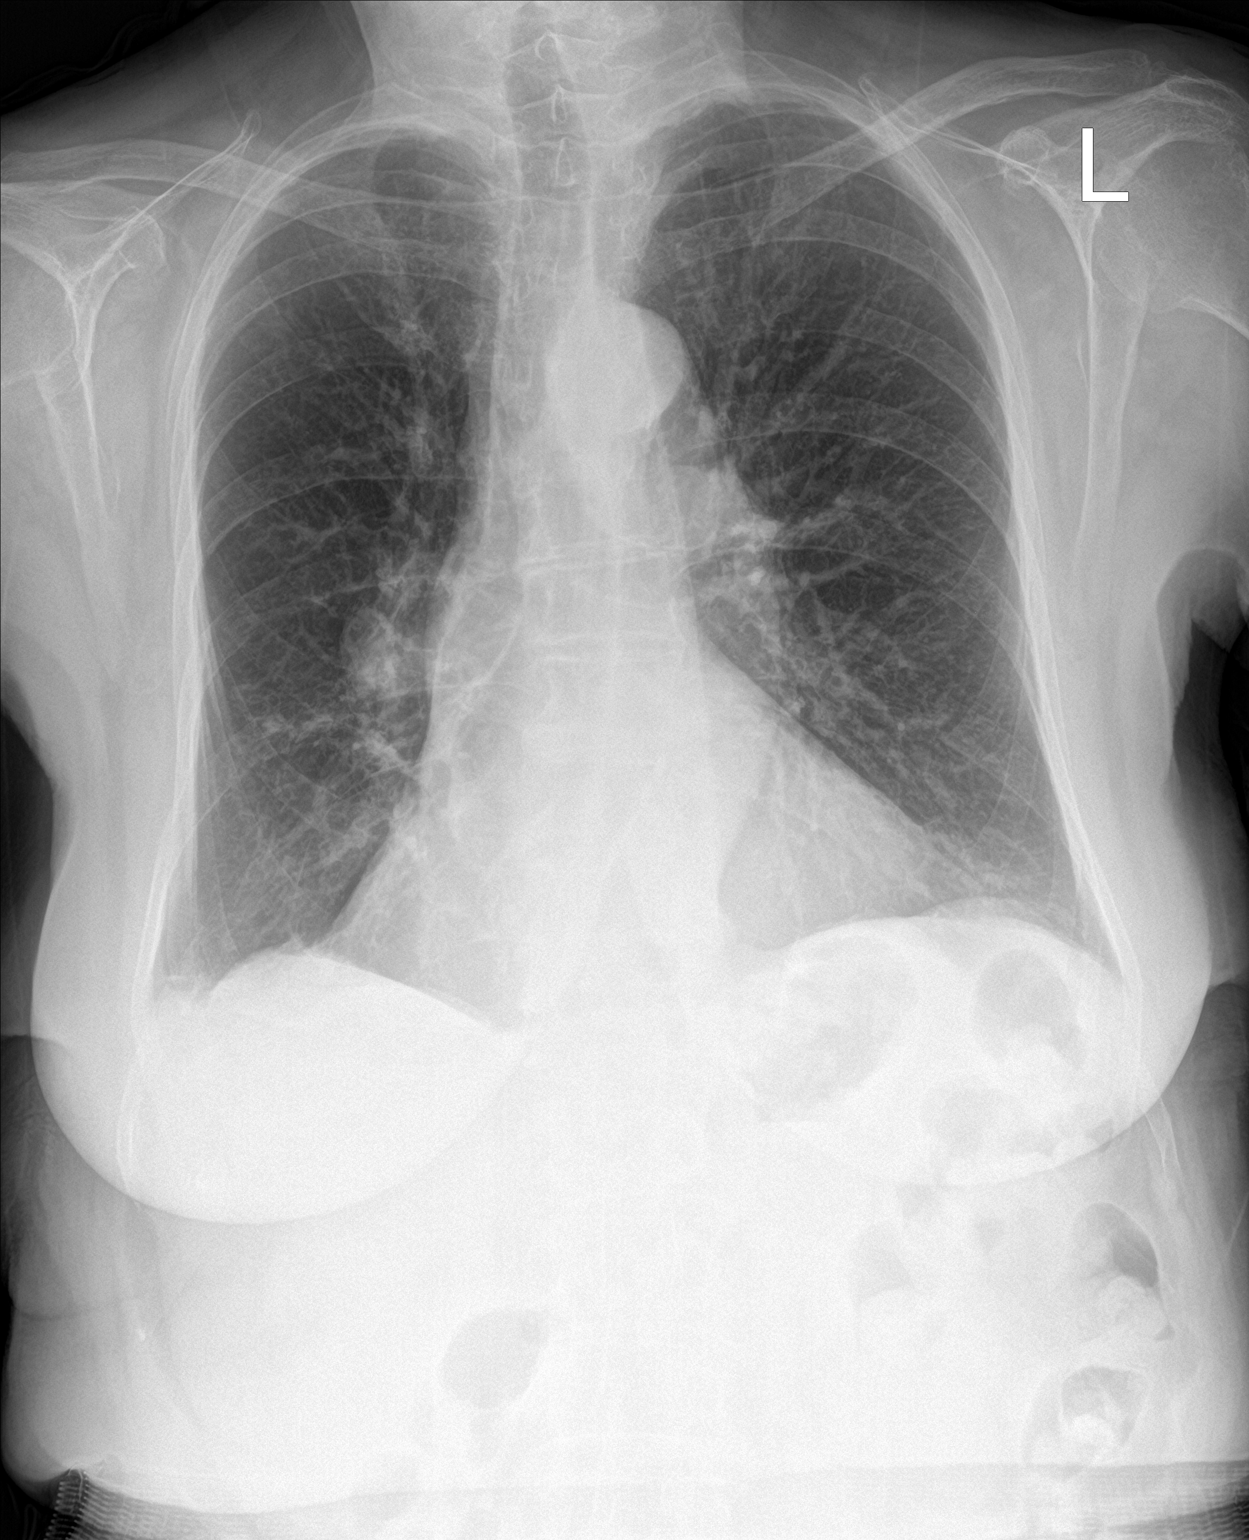

[chest lat]
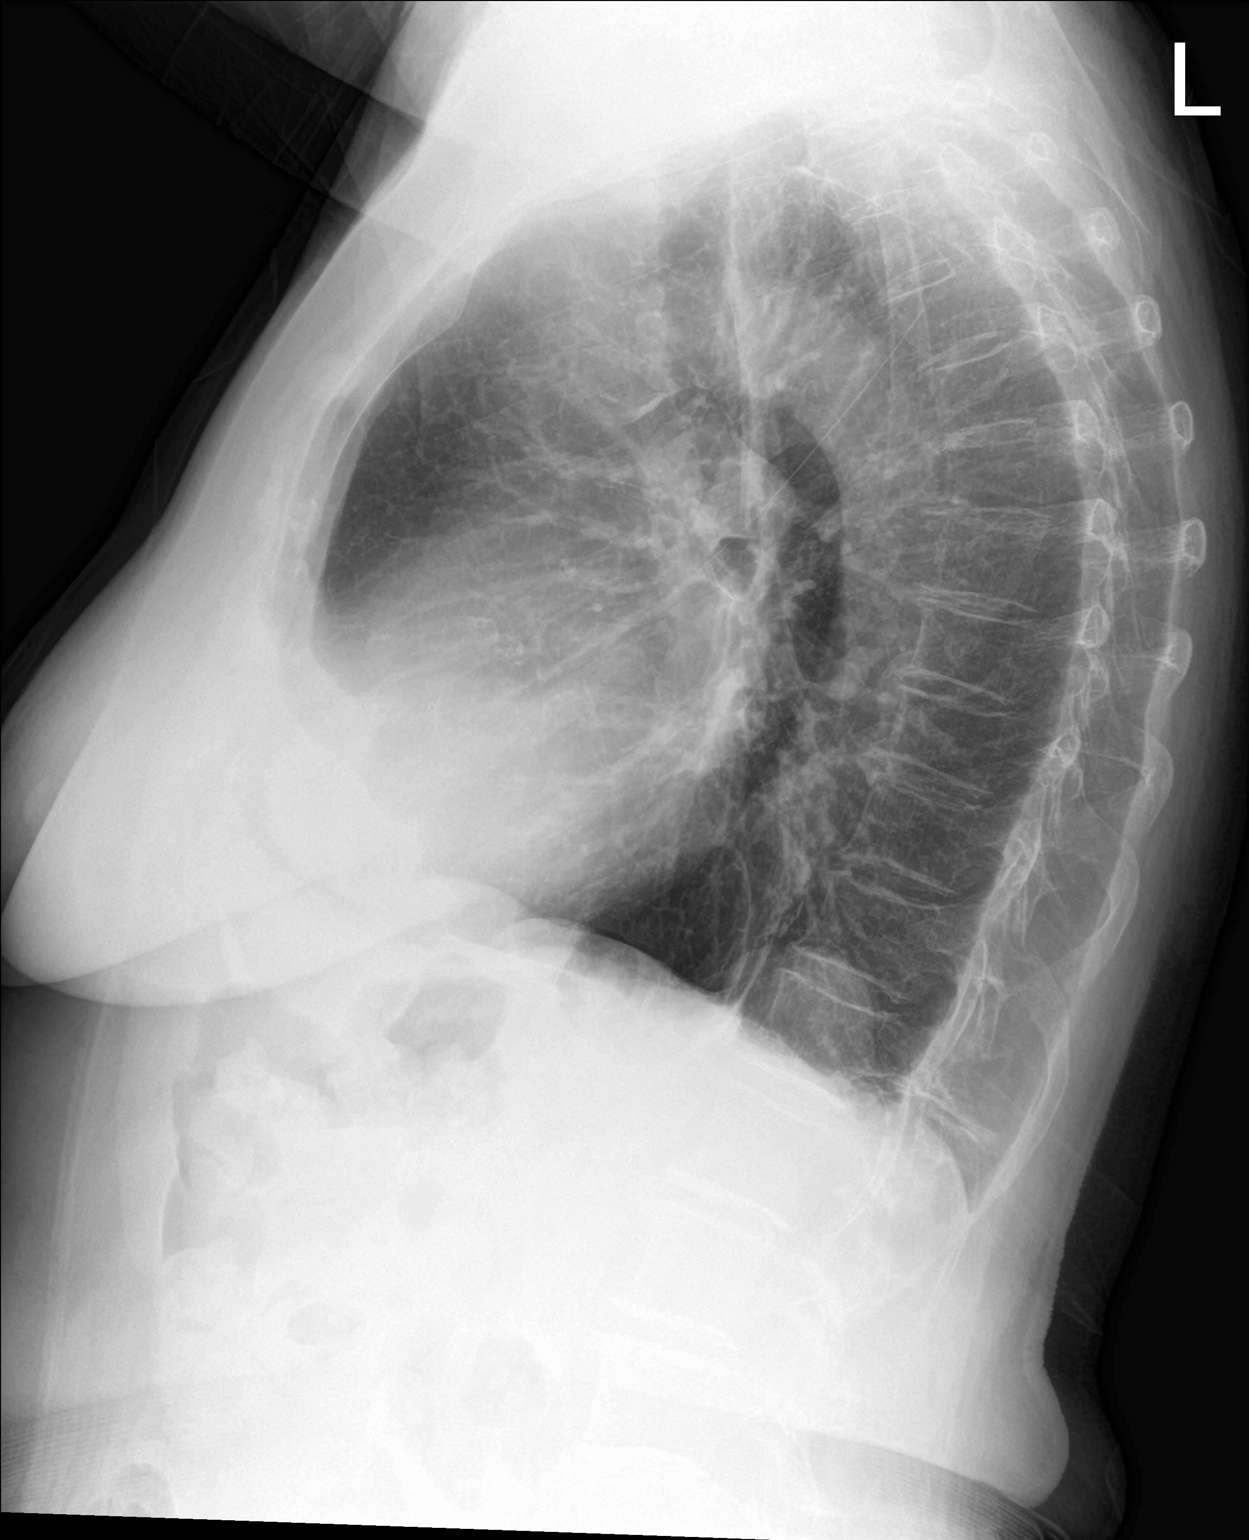

[2 of 2 positions shown; findings below may reference images not displayed]

FINDINGS: Mild cardiomegaly. Thoracic aorta is ectatic. COPD changes with some
flattening of the diaphragms. No focal consolidation, pleural
effusion or vascular congestion. Mild thoracic spondylosis.
IMPRESSION: No active cardiopulmonary disease.

## 2023-01-06 ENCOUNTER — Other Ambulatory Visit: Payer: Self-pay | Admitting: Family Medicine

## 2023-01-06 DIAGNOSIS — J4520 Mild intermittent asthma, uncomplicated: Secondary | ICD-10-CM

## 2023-02-03 ENCOUNTER — Telehealth: Payer: Self-pay | Admitting: Family Medicine

## 2023-02-03 NOTE — Telephone Encounter (Signed)
Please advise, see previous notes in thread. Thank you.

## 2023-02-03 NOTE — Telephone Encounter (Signed)
Prescription Request  02/03/2023  LOV: 11/04/2022  What is the name of the medication or equipment?   ALPRAZolam (XANAX) 0.5 MG tablet  **refill needed before next appointment**  Have you contacted your pharmacy to request a refill? Yes   Which pharmacy would you like this sent to?  [id number] [place], [place] - [address] [place] [address] Phone: [phone] Fax: [phone]    Patient notified that their request is being sent to the clinical staff for review and that they should receive a response within 2 business days.   Please advise patient's daughter Aurea Graff at [phone] with questions.

## 2023-02-05 NOTE — Telephone Encounter (Signed)
Attempted to call pt. NA and NVM. Will try again later.

## 2023-02-06 ENCOUNTER — Ambulatory Visit (INDEPENDENT_AMBULATORY_CARE_PROVIDER_SITE_OTHER): Payer: PPO | Admitting: Family Medicine

## 2023-02-06 ENCOUNTER — Encounter: Payer: Self-pay | Admitting: Family Medicine

## 2023-02-06 DIAGNOSIS — F5101 Primary insomnia: Secondary | ICD-10-CM | POA: Diagnosis not present

## 2023-02-06 MED ORDER — ALPRAZOLAM 0.5 MG PO TABS: 0.5000 mg | ORAL_TABLET | Freq: Every evening | ORAL | 1 refills | Status: DC | PRN

## 2023-02-06 NOTE — Progress Notes (Signed)
Acute Office Visit  Subjective:     Patient ID: Rhonda Tapia, female    DOB: 1926-06-22, 89 y.o.   MRN: [id number]  Chief Complaint  Patient presents with   Follow-up    Med refills    HPI Patient is in today to discuss medications and refills. Pt is aware of risks of psychoactive medication use to include increased sedation, respiratory suppression, falls, extrapyramidal movements, dependence and cardiovascular events. Pt would like to continue treatment as benefit determined to outweigh risk. PDMP reviewed and appropriate and refill given. UDS completed previously. Controlled substance agreement signed previously. Follow up in 6 months.   Review of Systems  Neurological: Negative.   Psychiatric/Behavioral:  The patient has insomnia.   All other systems reviewed and are negative.   Past Medical History:  Diagnosis Date   No pertinent past medical history    pt denies any problems   Pneumonia 06/26/11   currently   Routine general medical examination at a health care facility 07/26/2013    Past Surgical History:  Procedure Laterality Date   APPENDECTOMY     hemoroidectomy     Current Outpatient Medications on File Prior to Visit  Medication Sig Dispense Refill   acetaminophen (TYLENOL) 500 MG tablet Take 500 mg by mouth every 6 (six) hours as needed.     albuterol (VENTOLIN HFA) 108 (90 Base) MCG/ACT inhaler TAKE 2 PUFFS INTO THE LUNGS UP TO EVERY 6HRS AS NEEDED FOR WHEEZE OR SHORTNESS OF BREATH 8.5 each 1   cetirizine (ZYRTEC) 5 MG tablet Take 5 mg by mouth daily.     loratadine (CLARITIN) 10 MG tablet Take 10 mg by mouth daily.     omeprazole (PRILOSEC) 20 MG capsule TAKE 1 CAPSULE BY MOUTH EVERY DAY 90 capsule 0   No current facility-administered medications on file prior to visit.   Allergies  Allergen Reactions   Penicillins Hives       Objective:    BP (!) 140/78   Pulse 85   Temp 98.1 F (36.7 C) (Oral)   Ht 5\' 1"  (1.549 m)   Wt 126 lb (57.2 kg)    SpO2 91%   BMI 23.81 kg/m    Physical Exam Vitals and nursing note reviewed.  Constitutional:      Appearance: Normal appearance. She is normal weight.  HENT:     Head: Normocephalic and atraumatic.  Skin:    General: Skin is warm and dry.  Neurological:     General: No focal deficit present.     Mental Status: She is alert and oriented to person, place, and time. Mental status is at baseline.  Psychiatric:        Mood and Affect: Mood normal.        Behavior: Behavior normal.        Thought Content: Thought content normal.        Judgment: Judgment normal.     No results found for any visits on 02/06/23.      Assessment & Plan:   Problem List Items Addressed This Visit     Insomnia   Relevant Medications   ALPRAZolam (XANAX) 0.5 MG tablet    Meds ordered this encounter  Medications   ALPRAZolam (XANAX) 0.5 MG tablet    Sig: Take 1 tablet (0.5 mg total) by mouth at bedtime as needed for anxiety.    Dispense:  90 tablet    Refill:  1    Order Specific Question:  Supervising Provider    Answer:   Lynnea Ferrier T [[id number]]    Return in about 6 months (around 08/09/2023) for annual physical with labs 1 week prior.  [name], FNP

## 2023-02-11 DIAGNOSIS — Z85828 Personal history of other malignant neoplasm of skin: Secondary | ICD-10-CM | POA: Diagnosis not present

## 2023-02-11 DIAGNOSIS — L738 Other specified follicular disorders: Secondary | ICD-10-CM | POA: Diagnosis not present

## 2023-02-11 DIAGNOSIS — D1801 Hemangioma of skin and subcutaneous tissue: Secondary | ICD-10-CM | POA: Diagnosis not present

## 2023-02-11 DIAGNOSIS — L570 Actinic keratosis: Secondary | ICD-10-CM | POA: Diagnosis not present

## 2023-02-11 DIAGNOSIS — L820 Inflamed seborrheic keratosis: Secondary | ICD-10-CM | POA: Diagnosis not present

## 2023-02-18 ENCOUNTER — Telehealth: Payer: Self-pay

## 2023-02-18 NOTE — Telephone Encounter (Signed)
Reached out to patient to set up follow up visit with provider to discuss chronic conditions.  Telephone encounter attempt : 1   No answer. Elijio Miles [facility] Specialist

## 2023-05-19 ENCOUNTER — Ambulatory Visit: Payer: PPO | Admitting: Family Medicine

## 2023-05-26 ENCOUNTER — Ambulatory Visit (INDEPENDENT_AMBULATORY_CARE_PROVIDER_SITE_OTHER): Payer: PPO | Admitting: Family Medicine

## 2023-05-26 ENCOUNTER — Encounter: Payer: Self-pay | Admitting: Family Medicine

## 2023-05-26 VITALS — BP 146/88 | HR 78 | Temp 97.7°F | Ht 61.0 in | Wt 127.0 lb

## 2023-05-26 DIAGNOSIS — Z0000 Encounter for general adult medical examination without abnormal findings: Secondary | ICD-10-CM | POA: Diagnosis not present

## 2023-05-26 DIAGNOSIS — Z0001 Encounter for general adult medical examination with abnormal findings: Secondary | ICD-10-CM

## 2023-05-26 DIAGNOSIS — R030 Elevated blood-pressure reading, without diagnosis of hypertension: Secondary | ICD-10-CM

## 2023-05-26 DIAGNOSIS — E785 Hyperlipidemia, unspecified: Secondary | ICD-10-CM | POA: Diagnosis not present

## 2023-05-26 DIAGNOSIS — F411 Generalized anxiety disorder: Secondary | ICD-10-CM | POA: Diagnosis not present

## 2023-05-26 MED ORDER — MUPIROCIN 2 % EX OINT: TOPICAL_OINTMENT | CUTANEOUS | 3 refills | Status: AC

## 2023-05-26 NOTE — Assessment & Plan Note (Signed)
Today your medical history was reviewed and routine physical exam with labs was performed. Recommend physical activitiy as tolerated and consuming a well-balanced diet. Vaccine maintenance discussed. Appropriate health maintenance items reviewed. Return to office in 1 year for annual physical exam.

## 2023-05-26 NOTE — Progress Notes (Signed)
Acute Office Visit  Subjective:     Patient ID: Rhonda Tapia, female    DOB: July 05, 1926, 89 y.o.   MRN: [id number]  Chief Complaint  Patient presents with   Follow-up    F/u     HPI Patient is in today for an annual checkup and to discuss medications and refills. Pt is aware of risks of psychoactive medication use to include increased sedation, respiratory suppression, falls, extrapyramidal movements, dependence and cardiovascular events. Pt would like to continue treatment as benefit determined to outweigh risk. PDMP reviewed. UDS completed previously. Controlled substance agreement signed previously. She does endorse anxiety any time she has to visit doctor's offices, her BP is slightly elevated today. She denies chest pain, palpitations, vision changes, recurrent headaches, swelling of extremities, or dyspnea on exertion. No new concerns.   Review of Systems  Neurological: Negative.   Psychiatric/Behavioral:  The patient has insomnia.   All other systems reviewed and are negative.   Past Medical History:  Diagnosis Date   No pertinent past medical history    pt denies any problems   Pneumonia 06/26/11   currently   Routine general medical examination at a health care facility 07/26/2013    Past Surgical History:  Procedure Laterality Date   APPENDECTOMY     hemoroidectomy     Current Outpatient Medications on File Prior to Visit  Medication Sig Dispense Refill   acetaminophen (TYLENOL) 500 MG tablet Take 500 mg by mouth every 6 (six) hours as needed.     albuterol (VENTOLIN HFA) 108 (90 Base) MCG/ACT inhaler TAKE 2 PUFFS INTO THE LUNGS UP TO EVERY 6HRS AS NEEDED FOR WHEEZE OR SHORTNESS OF BREATH 8.5 each 1   ALPRAZolam (XANAX) 0.5 MG tablet Take 1 tablet (0.5 mg total) by mouth at bedtime as needed for anxiety. 90 tablet 1   cetirizine (ZYRTEC) 5 MG tablet Take 5 mg by mouth daily.     omeprazole (PRILOSEC) 20 MG capsule TAKE 1 CAPSULE BY MOUTH EVERY DAY 90 capsule 0    No current facility-administered medications on file prior to visit.   Allergies  Allergen Reactions   Penicillins Hives       Objective:    BP (!) 146/88   Pulse 78   Temp 97.7 F (36.5 C) (Oral)   Ht 5\' 1"  (1.549 m)   Wt 127 lb (57.6 kg)   SpO2 94%   BMI 24.00 kg/m    Physical Exam Vitals and nursing note reviewed.  Constitutional:      Appearance: Normal appearance. She is normal weight.  HENT:     Head: Normocephalic and atraumatic.     Right Ear: Decreased hearing noted.     Left Ear: Decreased hearing noted.  Cardiovascular:     Rate and Rhythm: Normal rate and regular rhythm.     Pulses: Normal pulses.     Heart sounds: Normal heart sounds.  Pulmonary:     Effort: Pulmonary effort is normal.     Breath sounds: Normal breath sounds.  Skin:    General: Skin is warm and dry.  Neurological:     General: No focal deficit present.     Mental Status: She is alert and oriented to person, place, and time. Mental status is at baseline.  Psychiatric:        Mood and Affect: Mood is anxious.        Behavior: Behavior normal.        Thought Content: Thought content  normal.        Judgment: Judgment normal.     No results found for any visits on 05/26/23.      Assessment & Plan:   Problem List Items Addressed This Visit     GAD (generalized anxiety disorder)    Well controlled on Xanax 0.5mg  nightly. She would not like to make medication changes today. PDMP reviewed, UDS and controlled substance agreement previously completed.      Relevant Orders   CBC with Differential/Platelet   COMPLETE METABOLIC PANEL WITH GFR   Physical exam, annual - Primary    Today your medical history was reviewed and routine physical exam with labs was performed. Recommend physical activitiy as tolerated and consuming a well-balanced diet. Vaccine maintenance discussed. Appropriate health maintenance items reviewed. Return to office in 1 year for annual physical exam.        Relevant Orders   CBC with Differential/Platelet   COMPLETE METABOLIC PANEL WITH GFR   Elevated blood pressure reading in office with white coat syndrome, without diagnosis of hypertension    BP 148/88 today. Her friend will monitor at home and report readings to office as she endorses anxiety and sleeplessness when she has office visits. Using shared decision making and discussion of risks vs benefits we will defer medication at this time. Seek medical care for chest pain, palpitations, shortness of breath with exertion, dizziness/lightheadedness, vision changes, recurrent headaches, or swelling of extremities.         Meds ordered this encounter  Medications   mupirocin ointment (BACTROBAN) 2 %    Sig: Apply to affected area TID for 7 days.    Dispense:  30 g    Refill:  3    Order Specific Question:   Supervising Provider    Answer:   Lynnea Ferrier T [[id number]]    Return for AWV.  [name], FNP

## 2023-05-26 NOTE — Assessment & Plan Note (Signed)
BP 148/88 today. Her friend will monitor at home and report readings to office as she endorses anxiety and sleeplessness when she has office visits. Using shared decision making and discussion of risks vs benefits we will defer medication at this time. Seek medical care for chest pain, palpitations, shortness of breath with exertion, dizziness/lightheadedness, vision changes, recurrent headaches, or swelling of extremities.

## 2023-05-26 NOTE — Assessment & Plan Note (Signed)
Well controlled on Xanax 0.5mg  nightly. She would not like to make medication changes today. PDMP reviewed, UDS and controlled substance agreement previously completed.

## 2023-05-27 LAB — COMPLETE METABOLIC PANEL WITH GFR
AG Ratio: 1.4 (calc) (ref 1.0–2.5)
ALT: 10 U/L (ref 6–29)
AST: 13 U/L (ref 10–35)
Albumin: 4 g/dL (ref 3.6–5.1)
Alkaline phosphatase (APISO): 36 U/L — ABNORMAL LOW (ref 37–153)
BUN: 11 mg/dL (ref 7–25)
CO2: 29 mmol/L (ref 20–32)
Calcium: 9.2 mg/dL (ref 8.6–10.4)
Chloride: 101 mmol/L (ref 98–110)
Creat: 0.72 mg/dL (ref 0.60–0.95)
Globulin: 2.8 g/dL (ref 1.9–3.7)
Glucose, Bld: 91 mg/dL (ref 65–99)
Potassium: 4.2 mmol/L (ref 3.5–5.3)
Sodium: 139 mmol/L (ref 135–146)
Total Bilirubin: 0.3 mg/dL (ref 0.2–1.2)
Total Protein: 6.8 g/dL (ref 6.1–8.1)
eGFR: 77 mL/min/{1.73_m2} (ref >=60)

## 2023-05-27 LAB — CBC WITH DIFFERENTIAL/PLATELET
Absolute Lymphocytes: 1389 {cells}/uL (ref 850–3900)
Absolute Monocytes: 744 {cells}/uL (ref 200–950)
Basophils Absolute: 37 {cells}/uL (ref 0–200)
Basophils Relative: 0.6 %
Eosinophils Absolute: 62 {cells}/uL (ref 15–500)
Eosinophils Relative: 1 %
HCT: 37.1 % (ref 35.0–45.0)
Hemoglobin: 12.2 g/dL (ref 11.7–15.5)
MCH: 31 pg (ref 27.0–33.0)
MCHC: 32.9 g/dL (ref 32.0–36.0)
MCV: 94.2 fL (ref 80.0–100.0)
MPV: 10.4 fL (ref 7.5–12.5)
Monocytes Relative: 12 %
Neutro Abs: 3968 {cells}/uL (ref 1500–7800)
Neutrophils Relative %: 64 %
Platelets: 278 10*3/uL (ref 140–400)
RBC: 3.94 10*6/uL (ref 3.80–5.10)
RDW: 12 % (ref 11.0–15.0)
Total Lymphocyte: 22.4 %
WBC: 6.2 10*3/uL (ref 3.8–10.8)

## 2023-05-27 LAB — LDL CHOLESTEROL, DIRECT: Direct LDL: 141 mg/dL — ABNORMAL HIGH (ref <100)

## 2023-06-09 ENCOUNTER — Other Ambulatory Visit: Payer: Self-pay | Admitting: Family Medicine

## 2023-06-09 DIAGNOSIS — J4520 Mild intermittent asthma, uncomplicated: Secondary | ICD-10-CM

## 2023-06-19 ENCOUNTER — Encounter: Payer: PPO | Admitting: Family Medicine

## 2023-06-30 ENCOUNTER — Encounter: Payer: PPO | Admitting: Family Medicine

## 2023-06-30 NOTE — Progress Notes (Unsigned)
Subjective

## 2023-07-02 ENCOUNTER — Encounter: Payer: Self-pay | Admitting: Family Medicine

## 2023-07-02 ENCOUNTER — Ambulatory Visit: Payer: PPO | Admitting: Family Medicine

## 2023-07-02 DIAGNOSIS — Z23 Encounter for immunization: Secondary | ICD-10-CM | POA: Diagnosis not present

## 2023-07-02 DIAGNOSIS — Z0000 Encounter for general adult medical examination without abnormal findings: Secondary | ICD-10-CM

## 2023-07-02 NOTE — Patient Instructions (Signed)
  Rhonda Tapia , Thank you for taking time to come for your Medicare Wellness Visit. I appreciate your ongoing commitment to your health goals. Please review the following plan we discussed and let me know if I can assist you in the future.   These are the goals we discussed:  Goals      Activity and Exercise Increased     Keep on walking and staying active        This is a list of the screening recommended for you and due dates:  Health Maintenance  Topic Date Due   DEXA scan (bone density measurement)  Never done   COVID-19 Vaccine (3 - Pfizer risk series) 07/18/2023*   Zoster (Shingles) Vaccine (1 of 2) 09/30/2023*   Pneumonia Vaccine (2 of 2 - PPSV23 or PCV20) 07/01/2024*   Medicare Annual Wellness Visit  07/01/2024   DTaP/Tdap/Td vaccine (2 - Td or Tdap) 05/27/2032   Flu Shot  Completed   HPV Vaccine  Aged Out  *Topic was postponed. The date shown is not the original due date.

## 2023-07-02 NOTE — Progress Notes (Addendum)
Subjective:   Rhonda Tapia is a 89 y.o. female who presents for Medicare Annual (Subsequent) preventive examination.  Visit Complete: Virtual I connected with  Rhonda Tapia on 07/02/23 by a  telephone  enabled telemedicine application and verified that I am speaking with the correct person using two identifiers.  Patient Location: Home  Provider Location: Office/Clinic  I discussed the limitations of evaluation and management by telemedicine. The patient expressed understanding and agreed to proceed.  Vital Signs: Because this visit was a virtual/telehealth visit, some criteria may be missing or patient reported. Any vitals not documented were not able to be obtained and vitals that have been documented are patient reported.  Patient Medicare AWV questionnaire was completed by the patient on 07/02/2023; I have confirmed that all information answered by patient is correct and no changes since this date.  Cardiac Risk Factors include: advanced age (>44men, >76 women)     Objective:    There were no vitals filed for this visit. There is no height or weight on file to calculate BMI.     07/02/2023    9:04 AM 06/18/2022    3:13 PM 04/27/2021    2:09 PM 06/26/2011    5:56 PM  Advanced Directives  Does Patient Have a Medical Advance Directive? Yes Yes Yes Patient does not have advance directive;Patient would not like information  Type of Advance Directive Living will;Healthcare Power of [employer] Power of [place];Living will Healthcare Power of Attorney   Does patient want to make changes to medical advance directive?   Yes (ED - Information included in AVS)   Copy of Healthcare Power of Attorney in Chart?   Yes - validated most recent copy scanned in chart (See row information)     Current Medications (verified) Outpatient Encounter Medications as of 07/02/2023  Medication Sig   acetaminophen (TYLENOL) 500 MG tablet Take 500 mg by mouth every 6 (six) hours as  needed.   albuterol (VENTOLIN HFA) 108 (90 Base) MCG/ACT inhaler TAKE 2 PUFFS INTO THE LUNGS UP TO EVERY 6HRS AS NEEDED FOR WHEEZE OR SHORTNESS OF BREATH   ALPRAZolam (XANAX) 0.5 MG tablet Take 1 tablet (0.5 mg total) by mouth at bedtime as needed for anxiety.   cetirizine (ZYRTEC) 5 MG tablet Take 5 mg by mouth daily.   mupirocin ointment (BACTROBAN) 2 % Apply to affected area TID for 7 days.   omeprazole (PRILOSEC) 20 MG capsule TAKE 1 CAPSULE BY MOUTH EVERY DAY   No facility-administered encounter medications on file as of 07/02/2023.    Allergies (verified) Penicillins   History: Past Medical History:  Diagnosis Date   No pertinent past medical history    pt denies any problems   Pneumonia 06/26/11   currently   Routine general medical examination at a health care facility 07/26/2013   Past Surgical History:  Procedure Laterality Date   APPENDECTOMY     hemoroidectomy     Family History  Problem Relation Age of Onset   Asthma Mother    Hypertension Mother    Stroke Mother    Alcohol abuse Father    Cancer Father    Alcohol abuse Brother    Cancer Brother        lung cancer   Social History   Socioeconomic History   Marital status: Widowed    Spouse name: Not on file   Number of children: Not on file   Years of education: Not on file   Highest education  level: Not on file  Occupational History   Not on file  Tobacco Use   Smoking status: Never   Smokeless tobacco: Never  Substance and Sexual Activity   Alcohol use: No   Drug use: No   Sexual activity: Never  Other Topics Concern   Not on file  Social History Narrative   Not on file   Social Drivers of Health   Financial Resource Strain: Low Risk  (07/02/2023)   Overall Financial Resource Strain (CARDIA)    Difficulty of Paying Living Expenses: Not hard at all  Food Insecurity: No Food Insecurity (07/02/2023)   Hunger Vital Sign    Worried About Running Out of Food in the Last Year: Never true     Ran Out of Food in the Last Year: Never true  Transportation Needs: No Transportation Needs (07/02/2023)   PRAPARE - Administrator, Civil Service (Medical): No    Lack of Transportation (Non-Medical): No  Physical Activity: Insufficiently Active (07/02/2023)   Exercise Vital Sign    Days of Exercise per Week: 5 days    Minutes of Exercise per Session: 20 min  Stress: No Stress Concern Present (07/02/2023)   Rhonda Tapia [employer] Occupational [employer] - Occupational Stress Questionnaire    Feeling of Stress : Not at all  Social Connections: Socially Isolated (07/02/2023)   Social Connection and Isolation Panel [NHANES]    Frequency of Communication with Friends and Family: More than three times a week    Frequency of Social Gatherings with Friends and Family: Once a week    Attends Religious Services: Never    Database administrator or Organizations: No    Attends Banker Meetings: Never    Marital Status: Widowed    Tobacco Counseling Counseling given: Not Answered   Clinical Intake:  Pre-visit preparation completed: Yes  Pain : No/denies pain     BMI - recorded: 24.1 Nutritional Status: BMI of 19-24  Normal Nutritional Risks: Other (Comment) Diabetes: No  How often do you need to have someone help you when you read instructions, pamphlets, or other written materials from your doctor or pharmacy?: 2 - Rarely (sometimes/daughter helps) What is the last grade level you completed in school?: 10th grade         Activities of Daily Living    07/02/2023    9:00 AM  In your present state of health, do you have any difficulty performing the following activities:  Hearing? 1  Vision? 1  Comment just small print  Difficulty concentrating or making decisions? 0  Walking or climbing stairs? 0  Dressing or bathing? 0  Doing errands, shopping? 1  Comment daughter helps  Preparing Food and eating ? Y  Using the Toilet? Y  In the past six months,  have you accidently leaked urine? Y  Do you have problems with loss of bowel control? N  Managing your Medications? Y  Managing your Finances? Y  Housekeeping or managing your Housekeeping? Y    Patient Care Team: [name], FNP as PCP - General (Family Medicine)  Indicate any recent Medical Services you may have received from other than [facility] providers in the past year (date may be approximate).     Assessment:   This is a routine wellness examination for Rhonda Tapia.  Hearing/Vision screen No results found.   Goals Addressed             This Visit's Progress    Activity and Exercise  Increased   On track    Keep on walking and staying active      Depression Screen    07/02/2023    9:13 AM 05/26/2023   12:09 PM 06/18/2022    2:46 PM 05/27/2022   11:08 AM 05/27/2022   10:53 AM 04/27/2021    2:57 PM 12/30/2020    6:57 AM  PHQ 2/9 Scores  PHQ - 2 Score 0 3 0 3 0 0 1  PHQ- 9 Score 1 6 0 7 0  7    Fall Risk    07/02/2023    9:05 AM 05/26/2023   12:08 PM 06/18/2022    3:05 PM 06/18/2022    2:46 PM 05/27/2022   11:09 AM  Fall Risk   Falls in the past year? 0 0 0 0 0  Number falls in past yr:  0     Injury with Fall?  0     Risk for fall due to : No Fall Risks No Fall Risks       MEDICARE RISK AT HOME:    TIMED UP AND GO:  Was the test performed?  No    Cognitive Function:        07/02/2023    9:05 AM 06/18/2022    3:18 PM 04/27/2021    2:16 PM  6CIT Screen  What Year? 4 points 0 points 0 points  What month? 3 points 0 points 0 points  What time? 0 points 0 points 0 points  Count back from 20 4 points 0 points 0 points  Months in reverse 4 points 0 points 0 points  Repeat phrase 6 points 2 points 4 points  Total Score 21 points 2 points 4 points    Immunizations Immunization History  Administered Date(s) Administered   Fluad Quad(high Dose 65+) 03/30/2019, 04/26/2020   Influenza Whole 04/14/2013   Influenza, High Dose Seasonal PF 05/05/2014,  04/13/2015, 05/02/2016, 04/17/2017, 04/09/2018, 04/10/2018, 03/30/2021   Influenza-Unspecified 04/04/2023   PFIZER(Purple Top)SARS-COV-2 Vaccination 08/20/2019, 09/15/2019   Pneumococcal Conjugate-13 07/26/2013   Tdap 05/27/2022    TDAP status: Due, Education has been provided regarding the importance of this vaccine. Advised may receive this vaccine at local pharmacy or Health Dept. Aware to provide a copy of the vaccination record if obtained from local pharmacy or Health Dept. Verbalized acceptance and understanding.  Flu Vaccine status: Up to date  Pneumococcal vaccine status: Due, Education has been provided regarding the importance of this vaccine. Advised may receive this vaccine at local pharmacy or Health Dept. Aware to provide a copy of the vaccination record if obtained from local pharmacy or Health Dept. Verbalized acceptance and understanding.  Covid-19 vaccine status: Completed vaccines  Qualifies for Shingles Vaccine?  Per daughter stated that pt has her vaccine   Zostavax completed  will wait for record   Shingrix Completed?: Yes  Screening Tests Health Maintenance  Topic Date Due   DEXA SCAN  Never done   COVID-19 Vaccine (3 - Pfizer risk series) 07/18/2023 (Originally 10/13/2019)   Zoster Vaccines- Shingrix (1 of 2) 09/30/2023 (Originally 06/12/1945)   Pneumonia Vaccine 109+ Years old (2 of 2 - PPSV23 or PCV20) 07/01/2024 (Originally 09/20/2013)   Medicare Annual Wellness (AWV)  07/01/2024   DTaP/Tdap/Td (2 - Td or Tdap) 05/27/2032   INFLUENZA VACCINE  Completed   HPV VACCINES  Aged Out    Health Maintenance  Health Maintenance Due  Topic Date Due   DEXA SCAN  Never done  Colorectal cancer screening: No longer required.   Mammogram status: No longer required due to age.  Bone Density status: Ordered refused. Pt provided with contact info and advised to call to schedule appt.  Lung Cancer Screening: (Low Dose CT Chest recommended if Age 45-80 years, 20  pack-year currently smoking OR have quit w/in 15years.) does not qualify.   Lung Cancer Screening Referral: na  Additional Screening:  Hepatitis C Screening: does not qualify; Completed n/a  Vision Screening: Recommended annual ophthalmology exams for early detection of glaucoma and other disorders of the eye. Is the patient up to date with their annual eye exam?  no Who is the provider or what is the name of the office in which the patient attends annual eye exams? Dr. Charlotte Sanes, [facility] If pt is not established with a provider, would they like to be referred to a provider to establish care? No .   Dental Screening: Recommended annual dental exams for proper oral hygiene  Diabetic Foot Exam: n/a  Community Resource Referral / Chronic Care Management: CRR required this visit?  No   CCM required this visit?  No     Plan:     I have personally reviewed and noted the following in the patient's chart:   Medical and social history Use of alcohol, tobacco or illicit drugs  Current medications and supplements including opioid prescriptions. Patient is not currently taking opioid prescriptions. Functional ability and status Nutritional status Physical activity Advanced directives List of other physicians Hospitalizations, surgeries, and ER visits in previous 12 months Vitals Screenings to include cognitive, depression, and falls Referrals and appointments  In addition, I have reviewed and discussed with patient certain preventive protocols, quality metrics, and best practice recommendations. A written personalized care plan for preventive services as well as general preventive health recommendations were provided to patient.     [name], FNP   07/02/2023   After Visit Summary: (Mail) Due to this being a telephonic visit, the after visit summary with patients personalized plan was offered to patient via mail   Nurse Notes: n/a

## 2023-07-02 NOTE — Addendum Note (Signed)
Addended by: Venia Carbon K on: 07/02/2023 02:25 PM   Modules accepted: Orders

## 2023-08-04 ENCOUNTER — Other Ambulatory Visit: Payer: Self-pay | Admitting: Family Medicine

## 2023-08-04 DIAGNOSIS — F5101 Primary insomnia: Secondary | ICD-10-CM

## 2023-08-04 NOTE — Telephone Encounter (Signed)
Copied from CRM [id number]. Topic: Clinical - Medication Refill >> Aug 04, 2023  1:45 PM Prudencio Pair wrote: Most Recent Primary Care Visit:  Provider: Kurtis Bushman S  Department: [facility]  Visit Type: MEDICARE AWV, SEQUENTIAL  Date: 07/02/2023  Medication: ALPRAZolam (XANAX) 0.5 MG tablet  Has the patient contacted their pharmacy? Yes, caregiver states she called them this morning & wasn't able to speak with anyone. States she always has to leave a message.  (Agent: If no, request that the patient contact the pharmacy for the refill. If patient does not wish to contact the pharmacy document the reason why and proceed with request.) (Agent: If yes, when and what did the pharmacy advise?)  Is this the correct pharmacy for this prescription? Yes If no, delete pharmacy and type the correct one.  This is the patient's preferred pharmacy:  [id number] [place], [place] - [address] [place] [address] Phone: [phone] Fax: [phone]   Has the prescription been filled recently? Yes  Is the patient out of the medication? No, but is almost out per caregiver, Aurea Graff.   Has the patient been seen for an appointment in the last year OR does the patient have an upcoming appointment? Yes  Can we respond through MyChart? No  Agent: Please be advised that Rx refills may take up to 3 business days. We ask that you follow-up with your pharmacy.

## 2023-08-04 NOTE — Telephone Encounter (Deleted)
Copied from CRM [id number]. Topic: Clinical - Medication Refill >> Aug 04, 2023  1:45 PM Prudencio Pair wrote: Most Recent Primary Care Visit:  Provider: Kurtis Bushman S  Department: [facility]  Visit Type: MEDICARE AWV, SEQUENTIAL  Date: 07/02/2023  Medication: ALPRAZolam (XANAX) 0.5 MG tablet  Has the patient contacted their pharmacy? Yes, caregiver states she called them this morning & wasn't able to speak with anyone. States she always has to leave a message.  (Agent: If no, request that the patient contact the pharmacy for the refill. If patient does not wish to contact the pharmacy document the reason why and proceed with request.) (Agent: If yes, when and what did the pharmacy advise?)  Is this the correct pharmacy for this prescription? Yes If no, delete pharmacy and type the correct one.  This is the patient's preferred pharmacy:  [id number] [place], [place] - [address] [place] [address] Phone: [phone] Fax: [phone]   Has the prescription been filled recently? Yes  Is the patient out of the medication? No, but is almost out per caregiver, Aurea Graff.   Has the patient been seen for an appointment in the last year OR does the patient have an upcoming appointment? Yes  Can we respond through MyChart? No  Agent: Please be advised that Rx refills may take up to 3 business days. We ask that you follow-up with your pharmacy.

## 2023-08-05 MED ORDER — ALPRAZOLAM 0.5 MG PO TABS: 0.5000 mg | ORAL_TABLET | Freq: Every evening | ORAL | 1 refills | Status: DC | PRN

## 2023-08-14 ENCOUNTER — Encounter: Payer: PPO | Admitting: Family Medicine

## 2023-09-15 ENCOUNTER — Other Ambulatory Visit: Payer: Self-pay | Admitting: Family Medicine

## 2023-09-15 DIAGNOSIS — J4520 Mild intermittent asthma, uncomplicated: Secondary | ICD-10-CM

## 2023-11-08 ENCOUNTER — Other Ambulatory Visit: Payer: Self-pay | Admitting: Family Medicine

## 2023-11-08 DIAGNOSIS — R062 Wheezing: Secondary | ICD-10-CM

## 2024-01-12 ENCOUNTER — Encounter: Payer: Self-pay | Admitting: Family Medicine

## 2024-01-12 ENCOUNTER — Ambulatory Visit (INDEPENDENT_AMBULATORY_CARE_PROVIDER_SITE_OTHER): Admitting: Family Medicine

## 2024-01-12 VITALS — BP 134/81 | HR 81 | Temp 97.9°F | Ht 61.0 in | Wt 123.0 lb

## 2024-01-12 DIAGNOSIS — Z79899 Other long term (current) drug therapy: Secondary | ICD-10-CM | POA: Diagnosis not present

## 2024-01-12 DIAGNOSIS — F5101 Primary insomnia: Secondary | ICD-10-CM | POA: Diagnosis not present

## 2024-01-12 DIAGNOSIS — F411 Generalized anxiety disorder: Secondary | ICD-10-CM

## 2024-01-12 MED ORDER — ALPRAZOLAM 0.5 MG PO TABS: 0.5000 mg | ORAL_TABLET | Freq: Every evening | ORAL | 0 refills | Status: DC | PRN

## 2024-01-12 NOTE — Assessment & Plan Note (Signed)
 Chronic. Pt is aware of risks of psychoactive medication use to include increased sedation, respiratory suppression, falls, extrapyramidal movements,  dependence and cardiovascular events.  Pt would like to continue treatment as benefit determined to outweigh risk.   Controlled substance agreement has been signed in the past.  UDS today.  PDMP reviewed and appropriate - will send in refill of alprazolam  0.5 mg qhs prn #80.  Patient to continue to monitor for side effects closely.  Follow up in 3 months.

## 2024-01-12 NOTE — Assessment & Plan Note (Signed)
 Well controlled on Xanax  0.5mg  nightly. She would not like to make medication changes today. PDMP reviewed, UDS and controlled substance agreement UTD

## 2024-01-12 NOTE — Progress Notes (Signed)
 Subjective:  HPI: Rhonda Tapia is a 89 y.o. female presenting on 01/12/2024 for Medical Management of Chronic Issues (Medication refill/Xanax  refill )   HPI Patient is in today to discuss medications and refills. Discussed risks of psychoactive medication use to include increased sedation, respiratory suppression, falls, extrapyramidal movements, dependence and cardiovascular events. Has tried trazodone  and other medications in the past but unable to tolerated due to hallucinations. She has difficulty with sleep onset due to anxiety. Has good sleep hygiene practices. Encouraged to try to wean this medication as it is not intended for long-term use. Discussed taking lower dose of 0.25mg  several days weekly and she is agreeable. UDS today.     05/26/2023   12:09 PM 06/18/2022    3:06 PM 06/18/2022    2:46 PM 05/27/2022   11:09 AM  GAD 7 : Generalized Anxiety Score  Nervous, Anxious, on Edge 0 0 0 0  Control/stop worrying 2 0 0 0  Worry too much - different things 1 0 0 0  Trouble relaxing 0 0 0 0  Restless 0 0 0 0  Easily annoyed or irritable 0 0 0 0  Afraid - awful might happen 0 0 0 0  Total GAD 7 Score 3 0 0 0  Anxiety Difficulty Not difficult at all         Review of Systems  All other systems reviewed and are negative.   Relevant past medical history reviewed and updated as indicated.   Past Medical History:  Diagnosis Date   No pertinent past medical history    pt denies any problems   Pneumonia 06/26/11   currently   Routine general medical examination at a health care facility 07/26/2013     Past Surgical History:  Procedure Laterality Date   APPENDECTOMY     hemoroidectomy      Allergies and medications reviewed and updated.   Current Outpatient Medications:    acetaminophen (TYLENOL) 500 MG tablet, Take 500 mg by mouth every 6 (six) hours as needed., Disp: , Rfl:    albuterol  (VENTOLIN  HFA) 108 (90 Base) MCG/ACT inhaler, TAKE 2 PUFFS INTO THE LUNGS UP  TO EVERY 6HRS AS NEEDED FOR WHEEZE OR SHORTNESS OF BREATH, Disp: 8.5 each, Rfl: 1   cetirizine (ZYRTEC) 5 MG tablet, Take 5 mg by mouth daily., Disp: , Rfl:    mupirocin  ointment (BACTROBAN ) 2 %, Apply to affected area TID for 7 days., Disp: 30 g, Rfl: 3   omeprazole  (PRILOSEC) 20 MG capsule, TAKE 1 CAPSULE BY MOUTH EVERY DAY, Disp: 90 capsule, Rfl: 0   [START ON 02/02/2024] ALPRAZolam  (XANAX ) 0.5 MG tablet, Take 1 tablet (0.5 mg total) by mouth at bedtime as needed for anxiety. Try to limit use to 5 days weekly or 0.5 tablet 2-3 days weekly for continued weaning, Disp: 80 tablet, Rfl: 0  Allergies  Allergen Reactions   Penicillins Hives    Objective:   BP 134/81   Pulse 81   Temp 97.9 F (36.6 C)   Ht 5' 1 (1.549 m)   Wt 123 lb (55.8 kg)   SpO2 92%   BMI 23.24 kg/m      01/12/2024   11:10 AM 05/26/2023   12:05 PM 05/26/2023   12:03 PM  Vitals with BMI  Height 5' 1  5' 1  Weight 123 lbs  127 lbs  BMI 23.25  24.01  Systolic 134 146 849  Diastolic 81 88 90  Pulse 81  78  Physical Exam Vitals and nursing note reviewed.  Constitutional:      Appearance: Normal appearance. She is normal weight.  HENT:     Head: Normocephalic and atraumatic.   Cardiovascular:     Rate and Rhythm: Normal rate and regular rhythm.     Pulses: Normal pulses.     Heart sounds: Normal heart sounds.  Pulmonary:     Effort: Pulmonary effort is normal.     Breath sounds: Normal breath sounds.   Skin:    General: Skin is warm and dry.   Neurological:     General: No focal deficit present.     Mental Status: She is alert and oriented to person, place, and time. Mental status is at baseline.   Psychiatric:        Mood and Affect: Mood normal.        Behavior: Behavior normal.        Thought Content: Thought content normal.        Judgment: Judgment normal.     Assessment & Plan:  GAD (generalized anxiety disorder) Assessment & Plan: Well controlled on Xanax  0.5mg  nightly. She  would not like to make medication changes today. PDMP reviewed, UDS and controlled substance agreement UTD   Primary insomnia Assessment & Plan: Chronic. Pt is aware of risks of psychoactive medication use to include increased sedation, respiratory suppression, falls, extrapyramidal movements,  dependence and cardiovascular events.  Pt would like to continue treatment as benefit determined to outweigh risk.   Controlled substance agreement has been signed in the past.  UDS today.  PDMP reviewed and appropriate - will send in refill of alprazolam  0.5 mg qhs prn #80.  Patient to continue to monitor for side effects closely.  Follow up in 3 months.   Orders: -     ALPRAZolam ; Take 1 tablet (0.5 mg total) by mouth at bedtime as needed for anxiety. Try to limit use to 5 days weekly or 0.5 tablet 2-3 days weekly for continued weaning  Dispense: 80 tablet; Refill: 0  Controlled substance agreement signed Assessment & Plan: Annual UDS today. Refill provided for Alprazolam  0.5mg  #80 for 3 months. Encouraged to limit use and try to wean.  Orders: -     DRUG MONITOR, PANEL 4, W/CONF, URINE     Follow up plan: Return in about 4 months (around 05/26/2024) for annual physical with labs 1 week prior and AWV.  Jeoffrey GORMAN Barrio, FNP

## 2024-01-12 NOTE — Assessment & Plan Note (Signed)
 Annual UDS today. Refill provided for Alprazolam  0.5mg  #80 for 3 months. Encouraged to limit use and try to wean.

## 2024-01-15 LAB — DRUG MONITOR, PANEL 4, W/CONF, URINE
Alphahydroxyalprazolam: 245 ng/mL — ABNORMAL HIGH (ref <25)
Alphahydroxymidazolam: NEGATIVE ng/mL (ref <50)
Alphahydroxytriazolam: NEGATIVE ng/mL (ref <50)
Aminoclonazepam: NEGATIVE ng/mL (ref <25)
Amphetamines: NEGATIVE ng/mL (ref <500)
Barbiturates: NEGATIVE ng/mL (ref <300)
Benzodiazepines: POSITIVE ng/mL — AB (ref <100)
Cocaine Metabolite: NEGATIVE ng/mL (ref <150)
Creatinine: 156.2 mg/dL (ref >=20.0)
Hydroxyethylflurazepam: NEGATIVE ng/mL (ref <50)
Lorazepam: NEGATIVE ng/mL (ref <50)
Methadone Metabolite: NEGATIVE ng/mL (ref <100)
Nordiazepam: NEGATIVE ng/mL (ref <50)
Opiates: NEGATIVE ng/mL (ref <100)
Oxazepam: NEGATIVE ng/mL (ref <50)
Oxidant: NEGATIVE ug/mL (ref <200)
Oxycodone: NEGATIVE ng/mL (ref <100)
Phencyclidine: NEGATIVE ng/mL (ref <25)
Temazepam: NEGATIVE ng/mL (ref <50)
pH: 6.5 (ref 4.5–9.0)

## 2024-01-15 LAB — DM TEMPLATE

## 2024-03-16 ENCOUNTER — Encounter: Payer: Self-pay | Admitting: Podiatry

## 2024-03-16 ENCOUNTER — Ambulatory Visit (INDEPENDENT_AMBULATORY_CARE_PROVIDER_SITE_OTHER): Admitting: Podiatry

## 2024-03-16 DIAGNOSIS — M79675 Pain in left toe(s): Secondary | ICD-10-CM

## 2024-03-16 DIAGNOSIS — M79674 Pain in right toe(s): Secondary | ICD-10-CM

## 2024-03-16 DIAGNOSIS — B351 Tinea unguium: Secondary | ICD-10-CM

## 2024-03-17 NOTE — Progress Notes (Signed)
 Subjective:   Patient ID: Rhonda Tapia, female   DOB: 89 y.o.   MRN: [id number]   HPI Chief Complaint  Patient presents with   Nail Problem    Rm13 Right great toe thick and painful.   89 year old female presents the office with above concerns.  She said all her nails are thick and elongated but most notably is her right big toenail.  It does cause pain.  No swelling, redness or drainage.  She did use some topical over-the-counter medications.  She thinks may have made it worse.  No open lesions.   Review of Systems  All other systems reviewed and are negative.  Past Medical History:  Diagnosis Date   No pertinent past medical history    pt denies any problems   Pneumonia 06/26/11   currently   Routine general medical examination at a health care facility 07/26/2013    Past Surgical History:  Procedure Laterality Date   APPENDECTOMY     hemoroidectomy       Current Outpatient Medications:    acetaminophen (TYLENOL) 500 MG tablet, Take 500 mg by mouth every 6 (six) hours as needed., Disp: , Rfl:    albuterol  (VENTOLIN  HFA) 108 (90 Base) MCG/ACT inhaler, TAKE 2 PUFFS INTO THE LUNGS UP TO EVERY 6HRS AS NEEDED FOR WHEEZE OR SHORTNESS OF BREATH, Disp: 8.5 each, Rfl: 1   ALPRAZolam  (XANAX ) 0.5 MG tablet, Take 1 tablet (0.5 mg total) by mouth at bedtime as needed for anxiety. Try to limit use to 5 days weekly or 0.5 tablet 2-3 days weekly for continued weaning, Disp: 80 tablet, Rfl: 0   cetirizine (ZYRTEC) 5 MG tablet, Take 5 mg by mouth daily., Disp: , Rfl:    mupirocin  ointment (BACTROBAN ) 2 %, Apply to affected area TID for 7 days., Disp: 30 g, Rfl: 3   omeprazole  (PRILOSEC) 20 MG capsule, TAKE 1 CAPSULE BY MOUTH EVERY DAY, Disp: 90 capsule, Rfl: 0  Allergies  Allergen Reactions   Penicillins Hives          Objective:  Physical Exam  General: AAO x3, NAD  Dermatological: Nails are hypertrophic, dystrophic, brittle, discolored, elongated 10. No surrounding redness or  drainage. Tenderness nails 1-5 bilaterally. No open lesions or pre-ulcerative lesions are identified today.  Vascular: Dorsalis Pedis artery and Posterior Tibial artery pedal pulses are 2/4 bilateral with immedate capillary fill time. There is no pain with calf compression, swelling, warmth, erythema.   Neruologic: Grossly intact via light touch bilateral.   Musculoskeletal: No other areas of discomfort.     Assessment:   Symptomatic onychomycosis     Plan:  -Treatment options discussed including all alternatives, risks, and complications -Etiology of symptoms were discussed -Nails debrided 10 without complications or bleeding. -Discussed treatment options for nail fungus.  Recommend routine debridement. -Daily foot inspection -Follow-up in 3 months or sooner if any problems arise. In the meantime, encouraged to call the office with any questions, concerns, change in symptoms.   Donnice Fees, DPM

## 2024-05-12 ENCOUNTER — Other Ambulatory Visit

## 2024-05-12 DIAGNOSIS — Z0000 Encounter for general adult medical examination without abnormal findings: Secondary | ICD-10-CM

## 2024-05-13 LAB — COMPREHENSIVE METABOLIC PANEL WITH GFR
AG Ratio: 1.5 (calc) (ref 1.0–2.5)
ALT: 10 U/L (ref 6–29)
AST: 14 U/L (ref 10–35)
Albumin: 4.1 g/dL (ref 3.6–5.1)
Alkaline phosphatase (APISO): 36 U/L — ABNORMAL LOW (ref 37–153)
BUN: 10 mg/dL (ref 7–25)
CO2: 30 mmol/L (ref 20–32)
Calcium: 9.5 mg/dL (ref 8.6–10.4)
Chloride: 100 mmol/L (ref 98–110)
Creat: 0.63 mg/dL (ref 0.60–0.95)
Globulin: 2.7 g/dL (ref 1.9–3.7)
Glucose, Bld: 91 mg/dL (ref 65–99)
Potassium: 4 mmol/L (ref 3.5–5.3)
Sodium: 138 mmol/L (ref 135–146)
Total Bilirubin: 0.3 mg/dL (ref 0.2–1.2)
Total Protein: 6.8 g/dL (ref 6.1–8.1)
eGFR: 82 mL/min/1.73m2 (ref >=60)

## 2024-05-13 LAB — CBC WITH DIFFERENTIAL/PLATELET
Absolute Lymphocytes: 1323 {cells}/uL (ref 850–3900)
Absolute Monocytes: 567 {cells}/uL (ref 200–950)
Basophils Absolute: 38 {cells}/uL (ref 0–200)
Basophils Relative: 0.6 %
Eosinophils Absolute: 57 {cells}/uL (ref 15–500)
Eosinophils Relative: 0.9 %
HCT: 39.3 % (ref 35.0–45.0)
Hemoglobin: 12.9 g/dL (ref 11.7–15.5)
MCH: 31.4 pg (ref 27.0–33.0)
MCHC: 32.8 g/dL (ref 32.0–36.0)
MCV: 95.6 fL (ref 80.0–100.0)
MPV: 10.2 fL (ref 7.5–12.5)
Monocytes Relative: 9 %
Neutro Abs: 4316 {cells}/uL (ref 1500–7800)
Neutrophils Relative %: 68.5 %
Platelets: 282 Thousand/uL (ref 140–400)
RBC: 4.11 Million/uL (ref 3.80–5.10)
RDW: 11.8 % (ref 11.0–15.0)
Total Lymphocyte: 21 %
WBC: 6.3 Thousand/uL (ref 3.8–10.8)

## 2024-05-13 LAB — LIPID PANEL
Cholesterol: 235 mg/dL — ABNORMAL HIGH (ref <200)
HDL: 71 mg/dL (ref >=50)
LDL Cholesterol (Calc): 138 mg/dL — ABNORMAL HIGH
Non-HDL Cholesterol (Calc): 164 mg/dL — ABNORMAL HIGH (ref <130)
Total CHOL/HDL Ratio: 3.3 (calc) (ref <5.0)
Triglycerides: 135 mg/dL (ref <150)

## 2024-05-13 LAB — TSH: TSH: 1.89 m[IU]/L (ref 0.40–4.50)

## 2024-05-18 ENCOUNTER — Ambulatory Visit: Admitting: Family Medicine

## 2024-05-18 ENCOUNTER — Encounter: Payer: Self-pay | Admitting: Family Medicine

## 2024-05-18 VITALS — BP 137/80 | HR 70 | Temp 98.1°F | Ht 61.0 in | Wt 124.6 lb

## 2024-05-18 DIAGNOSIS — F5101 Primary insomnia: Secondary | ICD-10-CM

## 2024-05-18 DIAGNOSIS — L989 Disorder of the skin and subcutaneous tissue, unspecified: Secondary | ICD-10-CM | POA: Insufficient documentation

## 2024-05-18 DIAGNOSIS — J4520 Mild intermittent asthma, uncomplicated: Secondary | ICD-10-CM | POA: Diagnosis not present

## 2024-05-18 DIAGNOSIS — F411 Generalized anxiety disorder: Secondary | ICD-10-CM

## 2024-05-18 DIAGNOSIS — Z0000 Encounter for general adult medical examination without abnormal findings: Secondary | ICD-10-CM

## 2024-05-18 DIAGNOSIS — Z0001 Encounter for general adult medical examination with abnormal findings: Secondary | ICD-10-CM | POA: Diagnosis not present

## 2024-05-18 MED ORDER — ALPRAZOLAM 0.5 MG PO TABS: 0.5000 mg | ORAL_TABLET | Freq: Every evening | ORAL | 0 refills | Status: DC | PRN

## 2024-05-18 NOTE — Progress Notes (Signed)
 Complete physical exam  Patient: Rhonda Tapia   DOB: Feb 10, 1926   89 y.o. Female  MRN: [id number]  Subjective:    Chief Complaint  Patient presents with   Annual Exam    CPE  Pt states that zyrtec makes her feel tired all the time even when she takes it before bed.     Rhonda Tapia is a 89 y.o. female who presents today for a complete physical exam. She reports consuming a general diet. The patient does not participate in regular exercise at present. She generally feels well. She reports sleeping well. She does not have additional problems to discuss today.   PMH includes COPD, anxiety, and insomnia.  COPD: well controlled  Anxiety: well controlled  Insomnia: working on weaning xanax , currently taking 0.25mg  several days per week, discussed risks   Discussed the use of AI scribe software for clinical note transcription with the patient, who gave verbal consent to proceed.  History of Present Illness Rhonda Tapia is a 89 year old female who presents for a routine follow-up visit.  She experiences periodic shortness of breath, particularly when walking in the grocery store, necessitating rest after walking down an aisle. She uses an albuterol  inhaler before going to the beauty shop and grocery store on Fridays to help manage this symptom.  She has a long-standing history of rhinorrhea, which she attributes to allergies. She has been unable to tolerate Zyrtec due to side effects of feeling sick and lethargic. She occasionally uses Afrin nasal spray but not on a daily basis. Her caregiver mentions that her brothers also had a history of rhinorrhea.  She has been taking alprazolam  for anxiety, with a regimen of alternating between a full tablet and a half tablet every other day. Taking a full tablet allows her to sleep for six and a half to seven hours, whereas a half tablet results in only one to two hours of sleep. She has been following this regimen since March.  She  previously took Prilosec for acid reflux but has discontinued it. She reports no current issues with acid reflux, swallowing, or throat pain, though she occasionally burps after eating certain foods.  No chest pain, palpitations, or abdominal issues. Bowel and bladder functions are normal.    Most recent fall risk assessment:    05/18/2024    2:35 PM  Fall Risk   Falls in the past year? 0  Number falls in past yr: 0  Injury with Fall? 0  Risk for fall due to : No Fall Risks;Other (Comment)  Risk for fall due to: Comment age  Follow up Falls evaluation completed     Most recent depression screenings:    05/18/2024    2:36 PM 07/02/2023    9:13 AM  PHQ 2/9 Scores  PHQ - 2 Score 2 0  PHQ- 9 Score 11 1    Vision:Not within last year  and Dental: No current dental problems  Patient Active Problem List   Diagnosis Date Noted   Skin lesion of back 05/18/2024   Physical exam, annual 05/26/2023   Elevated blood pressure reading in office with white coat syndrome, without diagnosis of hypertension 05/26/2023   Reactive airway disease 06/29/2021   Controlled substance agreement signed 12/30/2020   GAD (generalized anxiety disorder) 03/31/2019   Hyperlipidemia 03/30/2019   Insomnia 06/12/2016   Past Medical History:  Diagnosis Date   No pertinent past medical history    pt denies any problems  Pneumonia 06/26/11   currently   Routine general medical examination at a health care facility 07/26/2013   Past Surgical History:  Procedure Laterality Date   APPENDECTOMY     hemoroidectomy     Social History   Tobacco Use   Smoking status: Never   Smokeless tobacco: Never  Vaping Use   Vaping status: Never Used  Substance Use Topics   Alcohol use: No   Drug use: No   Family History  Problem Relation Age of Onset   Asthma Mother    Hypertension Mother    Stroke Mother    Alcohol abuse Father    Cancer Father    Alcohol abuse Brother    Cancer Brother        lung  cancer   Allergies  Allergen Reactions   Penicillins Hives      Patient Care Team: Kayla Jeoffrey RAMAN, FNP as PCP - General (Family Medicine)   Outpatient Medications Prior to Visit  Medication Sig   acetaminophen (TYLENOL) 500 MG tablet Take 500 mg by mouth every 6 (six) hours as needed.   albuterol  (VENTOLIN  HFA) 108 (90 Base) MCG/ACT inhaler TAKE 2 PUFFS INTO THE LUNGS UP TO EVERY 6HRS AS NEEDED FOR WHEEZE OR SHORTNESS OF BREATH   mupirocin  ointment (BACTROBAN ) 2 % Apply to affected area TID for 7 days.   [DISCONTINUED] ALPRAZolam  (XANAX ) 0.5 MG tablet Take 1 tablet (0.5 mg total) by mouth at bedtime as needed for anxiety. Try to limit use to 5 days weekly or 0.5 tablet 2-3 days weekly for continued weaning   [DISCONTINUED] cetirizine (ZYRTEC) 5 MG tablet Take 5 mg by mouth daily. (Patient not taking: Reported on 05/18/2024)   [DISCONTINUED] omeprazole  (PRILOSEC) 20 MG capsule TAKE 1 CAPSULE BY MOUTH EVERY DAY (Patient not taking: Reported on 05/18/2024)   No facility-administered medications prior to visit.    Review of Systems  Constitutional: Negative.   HENT: Negative.    Eyes: Negative.   Respiratory:  Positive for shortness of breath.   Cardiovascular: Negative.   Gastrointestinal: Negative.   Genitourinary: Negative.   Musculoskeletal: Negative.   Skin: Negative.   Neurological: Negative.   Endo/Heme/Allergies: Negative.   Psychiatric/Behavioral:  The patient is nervous/anxious and has insomnia.   All other systems reviewed and are negative.         Objective:     BP 137/80   Pulse 70   Temp 98.1 F (36.7 C)   Ht 5' 1 (1.549 m)   Wt 124 lb 9.6 oz (56.5 kg)   SpO2 97%   BMI 23.54 kg/m  BP Readings from Last 3 Encounters:  05/18/24 137/80  01/12/24 134/81  05/26/23 (!) 146/88   Wt Readings from Last 3 Encounters:  05/18/24 124 lb 9.6 oz (56.5 kg)  01/12/24 123 lb (55.8 kg)  05/26/23 127 lb (57.6 kg)      Physical Exam Vitals and nursing note  reviewed.  Constitutional:      Appearance: Normal appearance. She is normal weight.  HENT:     Head: Normocephalic and atraumatic.     Right Ear: Tympanic membrane, ear canal and external ear normal.     Left Ear: Tympanic membrane, ear canal and external ear normal.     Nose: Nose normal.     Mouth/Throat:     Mouth: Mucous membranes are moist.     Pharynx: Oropharynx is clear.  Eyes:     Extraocular Movements: Extraocular movements intact.  Conjunctiva/sclera: Conjunctivae normal.     Pupils: Pupils are equal, round, and reactive to light.  Cardiovascular:     Rate and Rhythm: Normal rate and regular rhythm.     Pulses: Normal pulses.     Heart sounds: Normal heart sounds.  Pulmonary:     Effort: Pulmonary effort is normal.     Breath sounds: Normal breath sounds.  Abdominal:     General: Bowel sounds are normal.     Palpations: Abdomen is soft.  Musculoskeletal:        General: Normal range of motion.     Cervical back: Normal range of motion and neck supple.  Skin:    General: Skin is warm and dry.     Capillary Refill: Capillary refill takes less than 2 seconds.     Findings: Lesion present.      Neurological:     General: No focal deficit present.     Mental Status: She is alert and oriented to person, place, and time. Mental status is at baseline.  Psychiatric:        Mood and Affect: Mood normal.        Behavior: Behavior normal.        Thought Content: Thought content normal.        Judgment: Judgment normal.      No results found for any visits on 05/18/24. Last CBC Lab Results  Component Value Date   WBC 6.3 05/12/2024   HGB 12.9 05/12/2024   HCT 39.3 05/12/2024   MCV 95.6 05/12/2024   MCH 31.4 05/12/2024   RDW 11.8 05/12/2024   PLT 282 05/12/2024   Last metabolic panel Lab Results  Component Value Date   GLUCOSE 91 05/12/2024   NA 138 05/12/2024   K 4.0 05/12/2024   CL 100 05/12/2024   CO2 30 05/12/2024   BUN 10 05/12/2024   CREATININE  0.63 05/12/2024   EGFR 82 05/12/2024   CALCIUM 9.5 05/12/2024   PROT 6.8 05/12/2024   ALBUMIN 3.9 11/16/2021   BILITOT 0.3 05/12/2024   ALKPHOS 35 (L) 11/16/2021   AST 14 05/12/2024   ALT 10 05/12/2024   ANIONGAP 7 11/16/2021   Last lipids Lab Results  Component Value Date   CHOL 235 (H) 05/12/2024   HDL 71 05/12/2024   LDLCALC 138 (H) 05/12/2024   LDLDIRECT 141 (H) 05/26/2023   TRIG 135 05/12/2024   CHOLHDL 3.3 05/12/2024   Last hemoglobin A1c No results found for: HGBA1C Last thyroid functions Lab Results  Component Value Date   TSH 1.89 05/12/2024   Last vitamin D No results found for: 25OHVITD2, 25OHVITD3, VD25OH Last vitamin B12 and Folate No results found for: VITAMINB12, FOLATE      Assessment & Plan:    Routine Health Maintenance and Physical Exam  Immunization History  Administered Date(s) Administered   Fluad Quad(high Dose 65+) 03/30/2019, 04/26/2020   INFLUENZA, HIGH DOSE SEASONAL PF 05/05/2014, 04/13/2015, 05/02/2016, 04/17/2017, 04/09/2018, 04/10/2018, 03/30/2021   Influenza Whole 04/14/2013   Influenza-Unspecified 04/04/2023   PFIZER(Purple Top)SARS-COV-2 Vaccination 08/20/2019, 09/15/2019   Pneumococcal Conjugate-13 07/26/2013   Tdap 05/27/2022    Health Maintenance  Topic Date Due   Medicare Annual Wellness (AWV)  07/01/2024   COVID-19 Vaccine (3 - Pfizer risk series) 06/03/2024 (Originally 10/13/2019)   Pneumococcal Vaccine: 50+ Years (2 of 2 - PPSV23, PCV20, or PCV21) 07/01/2024 (Originally 09/20/2013)   Zoster Vaccines- Shingrix (1 of 2) 08/18/2024 (Originally 06/12/1945)   Influenza Vaccine  10/12/2024 (Originally 02/13/2024)  DEXA SCAN  01/11/2025 (Originally 06/11/1993)   DTaP/Tdap/Td (2 - Td or Tdap) 05/27/2032   Meningococcal B Vaccine  Aged Out    Discussed health benefits of physical activity, and encouraged her to engage in regular exercise appropriate for her age and condition.  Problem List Items Addressed This Visit      Insomnia   Relevant Medications   ALPRAZolam  (XANAX ) 0.5 MG tablet   GAD (generalized anxiety disorder)   Relevant Medications   ALPRAZolam  (XANAX ) 0.5 MG tablet   Reactive airway disease   Physical exam, annual - Primary   Skin lesion of back   Assessment and Plan Assessment & Plan Adult Wellness Visit Routine annual wellness visit. Slightly elevated cholesterol, no new medical issues. Good health for age. - Continue current medications as needed. - No new medications for cholesterol due to age.  Primary insomnia Chronic insomnia managed with alprazolam . Prefers 0.5 mg dose for better sleep despite dependency risk. - Continue alprazolam  0.5 mg at bedtime as needed for sleep.  Generalized anxiety disorder Anxiety related to medical visits managed with alprazolam . - Continue alprazolam  0.5 mg at bedtime as needed for anxiety.  Allergic rhinitis Chronic runny nose, likely allergic. Cetirizine not tolerated. Afrin used occasionally. - Consider alternative nasal sprays for symptom management. - Avoid daily use of Afrin to prevent rebound congestion.  Skin Lesion, back Lesion on back requires dermatologist evaluation and possible removal. Nodule is 1x2cm with black central area concerning for carcinoma. - schedule with dermatologist for evaluation and removal of the nodule ASAP.   Return in about 3 months (around 08/18/2024) for follow-up.     Jeoffrey GORMAN Barrio, FNP

## 2024-06-15 ENCOUNTER — Encounter: Payer: Self-pay | Admitting: Podiatry

## 2024-06-15 ENCOUNTER — Ambulatory Visit: Admitting: Podiatry

## 2024-06-15 DIAGNOSIS — M79675 Pain in left toe(s): Secondary | ICD-10-CM | POA: Diagnosis not present

## 2024-06-15 DIAGNOSIS — B351 Tinea unguium: Secondary | ICD-10-CM

## 2024-06-15 DIAGNOSIS — M79674 Pain in right toe(s): Secondary | ICD-10-CM | POA: Diagnosis not present

## 2024-06-20 NOTE — Progress Notes (Signed)
  Subjective:  Patient ID: Rhonda Tapia, female    DOB: [birth year],  MRN: [id number]  Rhonda Tapia presents to clinic today for painful mycotic toenails of both feet that are difficult to trim. Pain interferes with daily activities and wearing enclosed shoe gear comfortably.  Chief Complaint  Patient presents with   Nail Problem    Thick painful toenails, 3 month follow up    New problem(s): None.   PCP is Rhonda Jeoffrey RAMAN, FNP.  Allergies  Allergen Reactions   Penicillins Hives    Review of Systems: Negative except as noted in the HPI.  Objective: No changes noted in today's physical examination. There were no vitals filed for this visit. Rhonda Tapia is a pleasant 89 y.o. female WD, WN in NAD. AAO x 3.  Vascular Examination: Capillary refill time immediate b/l. Palpable pedal pulses. Pedal hair absent b/l. Pedal edema absent. No pain with calf compression b/l. Skin temperature gradient WNL b/l. No cyanosis or clubbing b/l. No ischemia or gangrene noted b/l.   Neurological Examination: Sensation grossly intact b/l with 10 gram monofilament.  Dermatological Examination: Pedal skin with normal turgor, texture and tone b/l.  No open wounds. No interdigital macerations.   Toenails 1-5 b/l thick, discolored, elongated with subungual debris and pain on dorsal palpation.   No hyperkeratotic nor porokeratotic lesions.  Musculoskeletal Examination: Muscle strength 5/5 to all lower extremity muscle groups bilaterally. No pain, crepitus or joint limitation noted with ROM bilateral LE. No gross bony deformities bilaterally.  Radiographs: None  Assessment/Plan: 1. Pain due to onychomycosis of toenails of both feet   Patient was evaluated and treated. All patient's and/or POA's questions/concerns addressed on today's visit. Toenails 1-5 b/l debrided in length and girth without incident. Continue soft, supportive shoe gear daily. Report any pedal injuries to medical  professional. Call office if there are any questions/concerns. -Patient/POA to call should there be question/concern in the interim.   Return in about 3 months (around 09/13/2024).  Delon LITTIE Merlin, DPM      [place] LOCATION: [address] [place] [address]                   Office [phone]   [facility] LOCATION: [address] [place], [place] [address] Office [phone]

## 2024-07-21 ENCOUNTER — Ambulatory Visit

## 2024-08-16 ENCOUNTER — Other Ambulatory Visit: Payer: Self-pay | Admitting: Family Medicine

## 2024-08-16 DIAGNOSIS — F5101 Primary insomnia: Secondary | ICD-10-CM

## 2024-08-18 ENCOUNTER — Ambulatory Visit: Admitting: Family Medicine

## 2024-08-18 NOTE — Telephone Encounter (Signed)
 Requested medication (s) are due for refill today: yes  Requested medication (s) are on the active medication list: yes  Last refill:  05/18/24 #90/0  Future visit scheduled: yes  Notes to clinic:  Unable to refill per protocol, cannot delegate.      Requested Prescriptions  Pending Prescriptions Disp Refills   ALPRAZolam  (XANAX ) 0.5 MG tablet 90 tablet 0    Sig: Take 1 tablet (0.5 mg total) by mouth at bedtime as needed for anxiety.     Not Delegated - Psychiatry: Anxiolytics/Hypnotics 2 Failed - 08/18/2024 11:22 AM      Failed - This refill cannot be delegated      Failed - Urine Drug Screen completed in last 360 days      Passed - Patient is not pregnant      Passed - Valid encounter within last 6 months    Recent Outpatient Visits           3 months ago Physical exam, annual   [place] [facility] Arun Herrod Jeoffrey RAMAN, FNP   7 months ago GAD (generalized anxiety disorder)   [place] [facility] Maryann Mccall Jeoffrey RAMAN, FNP   1 year ago Physical exam, annual   [place] [facility] Florena Kozma Jeoffrey RAMAN, FNP   1 year ago Primary insomnia   [place] [facility] Jeni Duling Jeoffrey RAMAN, FNP   1 year ago Controlled substance agreement signed    [facility] Kelbie Moro Jeoffrey RAMAN, FNP

## 2024-08-19 MED ORDER — ALPRAZOLAM 0.5 MG PO TABS: 0.5000 mg | ORAL_TABLET | Freq: Every evening | ORAL | 0 refills | Status: AC | PRN

## 2024-09-07 ENCOUNTER — Ambulatory Visit: Admitting: Family Medicine

## 2024-10-06 ENCOUNTER — Ambulatory Visit: Admitting: Podiatry
